# Patient Record
Sex: Female | Born: 1990 | Race: White | Hispanic: No | Marital: Single | Smoking: Never smoker
Health system: Southern US, Community
[De-identification: ages and names within clinical notes are randomized; demographics above are authoritative.]

## PROBLEM LIST (undated history)

## (undated) DIAGNOSIS — A630 Anogenital (venereal) warts: Secondary | ICD-10-CM

## (undated) DIAGNOSIS — Z3490 Encounter for supervision of normal pregnancy, unspecified, unspecified trimester: Secondary | ICD-10-CM

## (undated) DIAGNOSIS — Z3491 Encounter for supervision of normal pregnancy, unspecified, first trimester: Secondary | ICD-10-CM

## (undated) DIAGNOSIS — B379 Candidiasis, unspecified: Secondary | ICD-10-CM

## (undated) DIAGNOSIS — N898 Other specified noninflammatory disorders of vagina: Secondary | ICD-10-CM

## (undated) DIAGNOSIS — N926 Irregular menstruation, unspecified: Secondary | ICD-10-CM

## (undated) DIAGNOSIS — Z309 Encounter for contraceptive management, unspecified: Secondary | ICD-10-CM

## (undated) DIAGNOSIS — O039 Complete or unspecified spontaneous abortion without complication: Secondary | ICD-10-CM

## (undated) DIAGNOSIS — R350 Frequency of micturition: Secondary | ICD-10-CM

## (undated) DIAGNOSIS — Z3201 Encounter for pregnancy test, result positive: Secondary | ICD-10-CM

## (undated) DIAGNOSIS — E119 Type 2 diabetes mellitus without complications: Secondary | ICD-10-CM

## (undated) HISTORY — DX: Encounter for supervision of normal pregnancy, unspecified, unspecified trimester: Z34.90

## (undated) HISTORY — PX: OTHER SURGICAL HISTORY: SHX169

## (undated) HISTORY — DX: Candidiasis, unspecified: B37.9

## (undated) HISTORY — DX: Frequency of micturition: R35.0

## (undated) HISTORY — DX: Irregular menstruation, unspecified: N92.6

## (undated) HISTORY — DX: Encounter for supervision of normal pregnancy, unspecified, first trimester: Z34.91

## (undated) HISTORY — DX: Other specified noninflammatory disorders of vagina: N89.8

## (undated) HISTORY — DX: Complete or unspecified spontaneous abortion without complication: O03.9

## (undated) HISTORY — DX: Type 2 diabetes mellitus without complications: E11.9

## (undated) HISTORY — DX: Anogenital (venereal) warts: A63.0

## (undated) HISTORY — DX: Encounter for contraceptive management, unspecified: Z30.9

## (undated) HISTORY — PX: ANKLE FRACTURE SURGERY: SHX122

## (undated) HISTORY — DX: Encounter for pregnancy test, result positive: Z32.01

---
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
## 2008-05-09 ENCOUNTER — Emergency Department (HOSPITAL_COMMUNITY): Admission: EM | Admit: 2008-05-09 | Discharge: 2008-05-09 | Payer: Self-pay | Admitting: Emergency Medicine

## 2010-07-29 LAB — PREGNANCY, URINE: Preg Test, Ur: NEGATIVE

## 2010-08-27 ENCOUNTER — Other Ambulatory Visit (HOSPITAL_COMMUNITY)
Admission: RE | Admit: 2010-08-27 | Discharge: 2010-08-27 | Disposition: A | Discharge status: Home / Self Care | Payer: Self-pay | Source: Ambulatory Visit | Attending: Obstetrics & Gynecology | Admitting: Obstetrics & Gynecology

## 2010-08-27 ENCOUNTER — Other Ambulatory Visit: Payer: Self-pay | Admitting: Obstetrics & Gynecology

## 2010-08-27 DIAGNOSIS — Z01419 Encounter for gynecological examination (general) (routine) without abnormal findings: Secondary | ICD-10-CM | POA: Insufficient documentation
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
## 2010-10-08 ENCOUNTER — Other Ambulatory Visit: Payer: Self-pay | Admitting: Obstetrics and Gynecology

## 2011-01-08 ENCOUNTER — Encounter: Payer: Self-pay | Admitting: Emergency Medicine

## 2011-01-08 ENCOUNTER — Emergency Department (HOSPITAL_COMMUNITY): Payer: Self-pay

## 2011-01-08 ENCOUNTER — Emergency Department (HOSPITAL_COMMUNITY)
Admission: EM | Admit: 2011-01-08 | Discharge: 2011-01-08 | Disposition: A | Discharge status: Home / Self Care | Payer: Self-pay | Attending: Emergency Medicine | Admitting: Emergency Medicine

## 2011-01-08 DIAGNOSIS — M545 Low back pain, unspecified: Secondary | ICD-10-CM | POA: Insufficient documentation

## 2011-01-08 DIAGNOSIS — N390 Urinary tract infection, site not specified: Secondary | ICD-10-CM | POA: Insufficient documentation

## 2011-01-08 LAB — URINE MICROSCOPIC-ADD ON

## 2011-01-08 LAB — URINALYSIS, ROUTINE W REFLEX MICROSCOPIC
Bilirubin Urine: NEGATIVE
Ketones, ur: NEGATIVE mg/dL
Nitrite: NEGATIVE
Protein, ur: NEGATIVE mg/dL
Urobilinogen, UA: 0.2 mg/dL (ref 0.0–1.0)
pH: 6.5 (ref 5.0–8.0)

## 2011-01-08 MED ORDER — CEPHALEXIN 500 MG PO CAPS: 500.0000 mg | ORAL_CAPSULE | Freq: Four times a day (QID) | ORAL | Status: AC

## 2011-01-08 MED ORDER — HYDROCODONE-ACETAMINOPHEN 5-325 MG PO TABS: ORAL_TABLET | ORAL | Status: AC

## 2011-01-08 MED ORDER — HYDROCODONE-ACETAMINOPHEN 5-325 MG PO TABS
1.0000 | ORAL_TABLET | Freq: Once | ORAL | Status: AC
  Administered 2011-01-08: 1 via ORAL
  Filled 2011-01-08: qty 1

## 2011-01-08 MED ORDER — CYCLOBENZAPRINE HCL 10 MG PO TABS: 10.0000 mg | ORAL_TABLET | Freq: Three times a day (TID) | ORAL | Status: AC | PRN

## 2011-01-08 NOTE — ED Notes (Signed)
Pt c/o lower back pain x 1 week

## 2011-01-08 NOTE — ED Provider Notes (Signed)
History     CSN: [id number] Arrival date & time: 01/08/2011  3:32 PM  Chief Complaint  Patient presents with  . Back Pain    (Consider location/radiation/quality/duration/timing/severity/associated sxs/prior treatment) Patient is a 20 y.o. female presenting with back pain. The history is provided by the patient.  Back Pain  The current episode started more than 1 week ago. The problem occurs constantly. The problem has not changed since onset.The pain is associated with lifting heavy objects. The pain is present in the lumbar spine. The quality of the pain is described as burning and aching. The pain radiates to the right thigh. The pain is moderate. The symptoms are aggravated by bending, twisting and certain positions. The pain is the same all the time. Associated symptoms include leg pain. Pertinent negatives include no fever, no numbness, no headaches, no abdominal pain, no abdominal swelling, no bowel incontinence, no perianal numbness, no bladder incontinence, no dysuria, no pelvic pain, no paresthesias, no paresis, no tingling and no weakness. Associated symptoms comments: Left flank pain and left hip pain. She has tried NSAIDs for the symptoms. The treatment provided no relief.    History reviewed. No pertinent past medical history.  Past Surgical History  Procedure Date  . Ankle fracture surgery     History reviewed. No pertinent family history.  History  Substance Use Topics  . Smoking status: Never Smoker   . Smokeless tobacco: Not on file  . Alcohol Use: No    OB History    Grav Para Term Preterm Abortions TAB SAB Ect Mult Living   0               Review of Systems  Constitutional: Negative for fever and chills.  Gastrointestinal: Negative for abdominal pain and bowel incontinence.  Genitourinary: Negative for bladder incontinence, dysuria, urgency, decreased urine volume, vaginal bleeding, vaginal discharge, difficulty urinating, menstrual problem and pelvic  pain.  Musculoskeletal: Positive for myalgias and back pain.  Neurological: Negative for dizziness, tingling, weakness, numbness, headaches and paresthesias.  Hematological: Negative for adenopathy. Does not bruise/bleed easily.  All other systems reviewed and are negative.    Allergies  Review of patient's allergies indicates no known allergies.  Home Medications   Current Outpatient Rx  Name Route Sig Dispense Refill  . IBUPROFEN 200 MG PO TABS Oral Take 200 mg by mouth every 6 (six) hours as needed. Back pain       BP 109/64  Pulse 60  Temp 98.5 F (36.9 C)  Resp 20  Ht 5\' 1"  (1.549 m)  Wt 145 lb 5 oz (65.913 kg)  BMI 27.46 kg/m2  SpO2 95%  LMP 12/17/2010  Physical Exam  Nursing note and vitals reviewed. Constitutional: She appears well-developed and well-nourished. No distress.  HENT:  Head: Normocephalic and atraumatic.  Mouth/Throat: Oropharynx is clear and moist.  Neck: Normal range of motion. Neck supple.  Cardiovascular: Normal rate, regular rhythm and normal heart sounds.   Pulmonary/Chest: Effort normal and breath sounds normal.  Musculoskeletal: She exhibits tenderness. She exhibits no edema.       Right shoulder: She exhibits tenderness. She exhibits normal range of motion, no bony tenderness, no swelling, no laceration, normal pulse and normal strength.       Arms: Lymphadenopathy:    She has no cervical adenopathy.  Neurological: She is alert. She has normal reflexes. No cranial nerve deficit or sensory deficit. She exhibits normal muscle tone. Coordination and gait normal.  Reflex Scores:  Patellar reflexes are 2+ on the right side and 2+ on the left side.      Achilles reflexes are 2+ on the right side and 2+ on the left side. Skin: Skin is warm and dry.    ED Course  Procedures (including critical care time)  Results for orders placed during the hospital encounter of 01/08/11  URINALYSIS, ROUTINE W REFLEX MICROSCOPIC      Component Value  Range   Color, Urine YELLOW  YELLOW    Appearance CLEAR  CLEAR    Specific Gravity, Urine 1.010  1.005 - 1.030    pH 6.5  5.0 - 8.0    Glucose, UA NEGATIVE  NEGATIVE (mg/dL)   Hgb urine dipstick NEGATIVE  NEGATIVE    Bilirubin Urine NEGATIVE  NEGATIVE    Ketones, ur NEGATIVE  NEGATIVE (mg/dL)   Protein, ur NEGATIVE  NEGATIVE (mg/dL)   Urobilinogen, UA 0.2  0.0 - 1.0 (mg/dL)   Nitrite NEGATIVE  NEGATIVE    Leukocytes, UA SMALL (*) NEGATIVE   POCT PREGNANCY, URINE      Component Value Range   Preg Test, Ur NEGATIVE    URINE MICROSCOPIC-ADD ON      Component Value Range   Squamous Epithelial / LPF RARE  RARE    WBC, UA 3-6  <3 (WBC/hpf)   Bacteria, UA MANY (*) RARE     Dg Lumbar Spine Complete  01/08/2011  *RADIOLOGY REPORT*  Clinical Data: Low back pain for 1 week.  LUMBAR SPINE - COMPLETE 4+ VIEW  Comparison: None.  Findings: Lumbar vertebral alignment appears normal.  No lumbar spine fracture or acute subluxation is identified.  No acute lumbar spine findings noted.  IMPRESSION:  No significant abnormality identified.  Original Report Authenticated By: Dellia Cloud, M.D.        MDM    5:45 PM patient is ambulatory, no focal neuro deficits or motor weakness.  ttp of the right lumbar paraspinal muscles.  No CVA tenderness, fever, vomiting or obvious urinary sx's to suggest pyleonephritis  5:48 PM urine culture pending.  Will treat for UTI.  Pt agrees to follow-up with PMD      Bralee Feldt L. [place], [place] 01/08/11 1749

## 2011-01-08 NOTE — ED Notes (Signed)
Transported to radiology 

## 2011-01-10 LAB — URINE CULTURE: Culture  Setup Time: 201209270118

## 2011-01-10 NOTE — ED Provider Notes (Signed)
Medical screening examination/treatment/procedure(s) were performed by non-physician practitioner and as supervising physician I was immediately available for consultation/collaboration.   Geoffery Lyons, MD 01/10/11 [phone]

## 2011-01-11 NOTE — ED Notes (Signed)
+   urine culture. Tx'd with Keflex, sensitive to same per protocol MD

## 2011-01-28 LAB — CBC
HCT: 24.6 — ABNORMAL LOW
HCT: 34.2 — ABNORMAL LOW
HCT: 35.6 — ABNORMAL LOW
HCT: 35.8 — ABNORMAL LOW
HCT: 36
Hemoglobin: 11.1 — ABNORMAL LOW
Hemoglobin: 11.7 — ABNORMAL LOW
Hemoglobin: 12
Hemoglobin: 12.1
Hemoglobin: 12.1
Hemoglobin: 13.2
MCHC: 33.1
MCHC: 33.7
MCHC: 33.7
MCHC: 34.2
MCV: 81.8 — ABNORMAL LOW
MCV: 82.3
MCV: 83.2
MCV: 83.2
Platelets: 102 — ABNORMAL LOW
Platelets: 86 — ABNORMAL LOW
Platelets: 93 — ABNORMAL LOW
RBC: 4.02
RBC: 4.16
RBC: 4.34
RBC: 4.78
RDW: 12.8
RDW: 13
RDW: 13.2
RDW: 13.2
RDW: 13.3
RDW: 13.6
WBC: 7.9
WBC: 8.3
WBC: 9.3

## 2011-01-28 LAB — BASIC METABOLIC PANEL
BUN: 1 — ABNORMAL LOW
BUN: 4 — ABNORMAL LOW
CO2: 30
CO2: 30
Calcium: 9.2
GFR calc Af Amer: >60
GFR calc non Af Amer: >60
GFR calc non Af Amer: >60
Glucose, Bld: 100 — ABNORMAL HIGH
Glucose, Bld: 102 — ABNORMAL HIGH
Glucose, Bld: 88
Potassium: 3.8
Potassium: 4.1
Potassium: 4.2
Sodium: 139
Sodium: 139

## 2011-01-28 LAB — DIFFERENTIAL
Basophils Absolute: 0
Basophils Relative: 0
Eosinophils Absolute: 0.2
Eosinophils Relative: 3
Monocytes Absolute: 0.7

## 2011-01-28 LAB — TYPE AND SCREEN
ABO/RH(D): O POS
Antibody Screen: NEGATIVE

## 2011-01-29 LAB — CBC
HCT: 28.2 — ABNORMAL LOW
HCT: 28.6 — ABNORMAL LOW
HCT: 29.4 — ABNORMAL LOW
Hemoglobin: 9.3 — ABNORMAL LOW
Hemoglobin: 9.4 — ABNORMAL LOW
Hemoglobin: 9.4 — ABNORMAL LOW
Hemoglobin: 9.9 — ABNORMAL LOW
MCHC: 33.3
MCHC: 33.3
MCHC: 33.6
MCV: 80.2 — ABNORMAL LOW
MCV: 80.9 — ABNORMAL LOW
MCV: 81.6 — ABNORMAL LOW
MCV: 81.8 — ABNORMAL LOW
MCV: 82
MCV: 82.1
Platelets: 130 — ABNORMAL LOW
Platelets: 178
RBC: 3.4 — ABNORMAL LOW
RBC: 3.42 — ABNORMAL LOW
RBC: 3.44 — ABNORMAL LOW
RBC: 3.63 — ABNORMAL LOW
RBC: 3.66 — ABNORMAL LOW
RDW: 12.7
RDW: 12.7
RDW: 13.3
WBC: 14.1 — ABNORMAL HIGH
WBC: 8.8
WBC: 9.6
WBC: 9.9

## 2011-01-29 LAB — RAPID URINE DRUG SCREEN, HOSP PERFORMED
Amphetamines: NOT DETECTED
Barbiturates: NOT DETECTED
Benzodiazepines: POSITIVE — AB
Cocaine: NOT DETECTED
Opiates: NOT DETECTED
Tetrahydrocannabinol: NOT DETECTED

## 2011-01-29 LAB — I-STAT 8, (EC8 V) (CONVERTED LAB)
Acid-base deficit: 6 — ABNORMAL HIGH
BUN: 5 — ABNORMAL LOW
Bicarbonate: 20.2
Chloride: 108
Glucose, Bld: 224 — ABNORMAL HIGH
HCT: 42
Hemoglobin: 14.3
Operator id: 281221
Potassium: 3.4 — ABNORMAL LOW
Sodium: 140
TCO2: 21
pCO2, Ven: 42.2 — ABNORMAL LOW
pH, Ven: 7.288

## 2011-01-29 LAB — URINALYSIS, ROUTINE W REFLEX MICROSCOPIC
Bilirubin Urine: NEGATIVE
Glucose, UA: NEGATIVE
Ketones, ur: NEGATIVE
Leukocytes, UA: NEGATIVE
Nitrite: NEGATIVE
Protein, ur: 100 — AB
Specific Gravity, Urine: 1.013
Urobilinogen, UA: 0.2
pH: 7

## 2011-01-29 LAB — POCT I-STAT CREATININE
Creatinine, Ser: 0.7
Operator id: 281221

## 2011-01-29 LAB — COMPREHENSIVE METABOLIC PANEL
ALT: 71 — ABNORMAL HIGH
AST: 161 — ABNORMAL HIGH
Albumin: 3 — ABNORMAL LOW
Alkaline Phosphatase: 52
BUN: 1 — ABNORMAL LOW
CO2: 26
Calcium: 8.9
Chloride: 108
Creatinine, Ser: 0.55
GFR calc Af Amer: >60
GFR calc non Af Amer: >60
Glucose, Bld: 130 — ABNORMAL HIGH
Potassium: 3.8
Sodium: 140
Total Bilirubin: 0.8
Total Protein: 5.5 — ABNORMAL LOW

## 2011-01-29 LAB — BASIC METABOLIC PANEL
Calcium: 9.1
Chloride: 106
Creatinine, Ser: 0.54
GFR calc Af Amer: >60
Sodium: 139

## 2011-01-29 LAB — PROTIME-INR
INR: 1.2
INR: 1.3
Prothrombin Time: 15.4 — ABNORMAL HIGH
Prothrombin Time: 16.5 — ABNORMAL HIGH

## 2011-01-29 LAB — POCT PREGNANCY, URINE: Preg Test, Ur: NEGATIVE

## 2011-01-29 LAB — CROSSMATCH: Antibody Screen: NEGATIVE

## 2011-01-29 LAB — URINE MICROSCOPIC-ADD ON

## 2011-01-29 LAB — ABO/RH: ABO/RH(D): O POS

## 2011-01-29 LAB — LIPASE, BLOOD: Lipase: 37

## 2011-11-24 ENCOUNTER — Other Ambulatory Visit: Payer: Self-pay | Admitting: Obstetrics and Gynecology

## 2013-02-21 ENCOUNTER — Other Ambulatory Visit: Payer: Self-pay | Admitting: Obstetrics & Gynecology

## 2013-03-08 ENCOUNTER — Ambulatory Visit (INDEPENDENT_AMBULATORY_CARE_PROVIDER_SITE_OTHER): Payer: Self-pay | Admitting: Adult Health

## 2013-03-08 ENCOUNTER — Encounter: Payer: Self-pay | Admitting: Adult Health

## 2013-03-08 VITALS — BP 100/72 | Ht 61.0 in | Wt 144.5 lb

## 2013-03-08 DIAGNOSIS — Z3201 Encounter for pregnancy test, result positive: Secondary | ICD-10-CM

## 2013-03-08 DIAGNOSIS — Z3490 Encounter for supervision of normal pregnancy, unspecified, unspecified trimester: Secondary | ICD-10-CM

## 2013-03-08 DIAGNOSIS — N898 Other specified noninflammatory disorders of vagina: Secondary | ICD-10-CM

## 2013-03-08 HISTORY — DX: Other specified noninflammatory disorders of vagina: N89.8

## 2013-03-08 NOTE — Progress Notes (Signed)
Subjective:     Patient ID: [name], female   DOB: 10/22/1990, 22 y.o.   MRN: [id number]  HPI Javaria is a 22 year old white female in complaining of a vaginal discharge, has used Metrogel and monistat recently.Had some itching,but not now.She had a surprise + pregnancy test today in office.  She is in school and has been with same partner x 6 years and did not think she could get pregnant. Review of Systems See HPI Reviewed past medical,surgical, social and family history. Reviewed medications and allergies.     Objective:   Physical Exam BP 100/72  Ht 5\' 1"  (1.549 m)  Wt 144 lb 8 oz (65.545 kg)  BMI 27.32 kg/m2  LMP 10/05/2014Urine pregnancy test +, Skin warm and dry.Pelvic: external genitalia is normal in appearance, vagina: scant white discharge without odor, cervix:smooth , uterus: normal size, shape and contour, non tender, no masses felt, adnexa: no masses or tenderness noted.    Assessment:     Vaginal discharge Pregnant     Plan:     Given samples of Relnate DHA 1 daily Check QHCG, call in am for results Given confirmation for medicaid application Follow up in 2 weeks for [place] and new OB visit

## 2013-03-08 NOTE — Patient Instructions (Signed)
Call in am for [facility] Take relnate 1 daily Follow up in 2 weeks for [place] and new OB Pregnancy - First Trimester During sexual intercourse, millions of sperm go into the vagina. Only 1 sperm will penetrate and fertilize the female egg while it is in the Fallopian tube. One week later, the fertilized egg implants into the wall of the uterus. An embryo begins to develop into a baby. At 6 to 8 weeks, the eyes and face are formed and the heartbeat can be seen on ultrasound. At the end of 12 weeks (first trimester), all the baby's organs are formed. Now that you are pregnant, you will want to do everything you can to have a healthy baby. Two of the most important things are to get good prenatal care and follow your caregiver's instructions. Prenatal care is all the medical care you receive before the baby's birth. It is given to prevent, find, and treat problems during the pregnancy and childbirth. PRENATAL EXAMS  During prenatal visits, your weight, blood pressure, and urine are checked. This is done to make sure you are healthy and progressing normally during the pregnancy.  A pregnant woman should gain 25 to 35 pounds during the pregnancy. However, if you are overweight or underweight, your caregiver will advise you regarding your weight.  Your caregiver will ask and answer questions for you.  Blood work, cervical cultures, other necessary tests, and a Pap test are done during your prenatal exams. These tests are done to check on your health and the probable health of your baby. Tests are strongly recommended and done for HIV with your permission. This is the virus that causes AIDS. These tests are done because medicines can be given to help prevent your baby from being born with this infection should you have been infected without knowing it. Blood work is also used to find out your blood type, previous infections, and follow your blood levels (hemoglobin).  Low hemoglobin (anemia) is common during  pregnancy. Iron and vitamins are given to help prevent this. Later in the pregnancy, blood tests for diabetes will be done along with any other tests if any problems develop.  You may need other tests to make sure you and the baby are doing well. CHANGES DURING THE FIRST TRIMESTER  Your body goes through many changes during pregnancy. They vary from person to person. Talk to your caregiver about changes you notice and are concerned about. Changes can include:  Your menstrual period stops.  The egg and sperm carry the genes that determine what you look like. Genes from you and your partner are forming a baby. The female genes determine whether the baby is a boy or a girl.  Your body increases in girth and you may feel bloated.  Feeling sick to your stomach (nauseous) and throwing up (vomiting). If the vomiting is uncontrollable, call your caregiver.  Your breasts will begin to enlarge and become tender.  Your nipples may stick out more and become darker.  The need to urinate more. Painful urination may mean you have a bladder infection.  Tiring easily.  Loss of appetite.  Cravings for certain kinds of food.  At first, you may gain or lose a couple of pounds.  You may have changes in your emotions from day to day (excited to be pregnant or concerned something may go wrong with the pregnancy and baby).  You may have more vivid and strange dreams. HOME CARE INSTRUCTIONS   It is very important to  avoid all smoking, alcohol and non-prescribed drugs during your pregnancy. These affect the formation and growth of the baby. Avoid chemicals while pregnant to ensure the delivery of a healthy infant.  Start your prenatal visits by the 12th week of pregnancy. They are usually scheduled monthly at first, then more often in the last 2 months before delivery. Keep your caregiver's appointments. Follow your caregiver's instructions regarding medicine use, blood and lab tests, exercise, and  diet.  During pregnancy, you are providing food for you and your baby. Eat regular, well-balanced meals. Choose foods such as meat, fish, milk and other low fat dairy products, vegetables, fruits, and whole-grain breads and cereals. Your caregiver will tell you of the ideal weight gain.  You can help morning sickness by keeping soda crackers at the bedside. Eat a couple before arising in the morning. You may want to use the crackers without salt on them.  Eating 4 to 5 small meals rather than 3 large meals a day also may help the nausea and vomiting.  Drinking liquids between meals instead of during meals also seems to help nausea and vomiting.  A physical sexual relationship may be continued throughout pregnancy if there are no other problems. Problems may be early (premature) leaking of amniotic fluid from the membranes, vaginal bleeding, or belly (abdominal) pain.  Exercise regularly if there are no restrictions. Check with your caregiver or physical therapist if you are unsure of the safety of some of your exercises. Greater weight gain will occur in the last 2 trimesters of pregnancy. Exercising will help:  Control your weight.  Keep you in shape.  Prepare you for labor and delivery.  Help you lose your pregnancy weight after you deliver your baby.  Wear a good support or jogging bra for breast tenderness during pregnancy. This may help if worn during sleep too.  Ask when prenatal classes are available. Begin classes when they are offered.  Do not use hot tubs, steam rooms, or saunas.  Wear your seat belt when driving. This protects you and your baby if you are in an accident.  Avoid raw meat, uncooked cheese, cat litter boxes, and soil used by cats throughout the pregnancy. These carry germs that can cause birth defects in the baby.  The first trimester is a good time to visit your dentist for your dental health. Getting your teeth cleaned is okay. Use a softer toothbrush and  brush gently during pregnancy.  Ask for help if you have financial, counseling, or nutritional needs during pregnancy. Your caregiver will be able to offer counseling for these needs as well as refer you for other special needs.  Do not take any medicines or herbs unless told by your caregiver.  Inform your caregiver if there is any mental or physical domestic violence.  Make a list of emergency phone numbers of family, friends, hospital, and police and fire departments.  Write down your questions. Take them to your prenatal visit.  Do not douche.  Do not cross your legs.  If you have to stand for long periods of time, rotate you feet or take small steps in a circle.  You may have more vaginal secretions that may require a sanitary pad. Do not use tampons or scented sanitary pads. MEDICINES AND DRUG USE IN PREGNANCY  Take prenatal vitamins as directed. The vitamin should contain 1 milligram of folic acid. Keep all vitamins out of reach of children. Only a couple vitamins or tablets containing iron may be fatal  to a baby or young child when ingested.  Avoid use of all medicines, including herbs, over-the-counter medicines, not prescribed or suggested by your caregiver. Only take over-the-counter or prescription medicines for pain, discomfort, or fever as directed by your caregiver. Do not use aspirin, ibuprofen, or naproxen unless directed by your caregiver.  Let your caregiver also know about herbs you may be using.  Alcohol is related to a number of birth defects. This includes fetal alcohol syndrome. All alcohol, in any form, should be avoided completely. Smoking will cause low birth rate and premature babies.  Street or illegal drugs are very harmful to the baby. They are absolutely forbidden. A baby born to an addicted mother will be addicted at birth. The baby will go through the same withdrawal an adult does.  Let your caregiver know about any medicines that you have to take and  for what reason you take them. SEEK MEDICAL CARE IF:  You have any concerns or worries during your pregnancy. It is better to call with your questions if you feel they cannot wait, rather than worry about them. SEEK IMMEDIATE MEDICAL CARE IF:   An unexplained oral temperature above 102 F (38.9 C) develops, or as your caregiver suggests.  You have leaking of fluid from the vagina (birth canal). If leaking membranes are suspected, take your temperature and inform your caregiver of this when you call.  There is vaginal spotting or bleeding. Notify your caregiver of the amount and how many pads are used.  You develop a bad smelling vaginal discharge with a change in the color.  You continue to feel sick to your stomach (nauseated) and have no relief from remedies suggested. You vomit blood or coffee ground-like materials.  You lose more than 2 pounds of weight in 1 week.  You gain more than 2 pounds of weight in 1 week and you notice swelling of your face, hands, feet, or legs.  You gain 5 pounds or more in 1 week (even if you do not have swelling of your hands, face, legs, or feet).  You get exposed to [place] measles and have never had them.  You are exposed to fifth disease or chickenpox.  You develop belly (abdominal) pain. Round ligament discomfort is a common non-cancerous (benign) cause of abdominal pain in pregnancy. Your caregiver still must evaluate this.  You develop headache, fever, diarrhea, pain with urination, or shortness of breath.  You fall or are in a car accident or have any kind of trauma.  There is mental or physical violence in your home. Document Released: 03/25/2001 Document Revised: 12/24/2011 Document Reviewed: 09/26/2008 [facility] Patient Information 2014 [place], [place].

## 2013-03-09 ENCOUNTER — Telehealth: Payer: Self-pay | Admitting: Adult Health

## 2013-03-09 LAB — HCG, QUANTITATIVE, PREGNANCY: hCG, Beta Chain, Quant, S: 49.4 m[IU]/mL

## 2013-03-09 NOTE — Telephone Encounter (Signed)
Throat itchy ok is use warm salt water gargles and cough gtts

## 2013-03-09 NOTE — Telephone Encounter (Signed)
Pt aware of QHCG keep appt

## 2013-03-14 ENCOUNTER — Telehealth: Payer: Self-pay | Admitting: Adult Health

## 2013-03-14 ENCOUNTER — Telehealth: Payer: Self-pay

## 2013-03-14 NOTE — Telephone Encounter (Signed)
Pt wants to ask Victorino Dike a few questions. Pt been reading a lot of things online, pt states that she thinks that she is farther along than what she said. Pt states that she is having more symptoms. Pt wants to speak to JAG.

## 2013-03-14 NOTE — Telephone Encounter (Signed)
Pt has already spoken to JAG from last message.

## 2013-03-14 NOTE — Telephone Encounter (Signed)
Complains of discharge and anxious to come in in am at 8:30

## 2013-03-15 ENCOUNTER — Telehealth: Payer: Self-pay | Admitting: Adult Health

## 2013-03-15 ENCOUNTER — Encounter: Payer: Self-pay | Admitting: Adult Health

## 2013-03-15 ENCOUNTER — Ambulatory Visit (INDEPENDENT_AMBULATORY_CARE_PROVIDER_SITE_OTHER): Payer: Medicaid Other | Admitting: Adult Health

## 2013-03-15 VITALS — BP 120/76 | Ht 61.0 in | Wt 144.0 lb

## 2013-03-15 DIAGNOSIS — O200 Threatened abortion: Secondary | ICD-10-CM

## 2013-03-15 DIAGNOSIS — O209 Hemorrhage in early pregnancy, unspecified: Secondary | ICD-10-CM

## 2013-03-15 NOTE — Telephone Encounter (Signed)
Pt aware numbers dropped and that means miscarriage

## 2013-03-15 NOTE — Telephone Encounter (Signed)
Pt spoke with JAG earlier about lab results.

## 2013-03-15 NOTE — Patient Instructions (Signed)
No sex Follow up in 2 days for labs

## 2013-03-15 NOTE — Progress Notes (Signed)
Subjective:     Patient ID: Anna Hartman, female   DOB: 1991/03/01, 22 y.o.   MRN: [id number]  HPI Anna Hartman is a 22 year old white female, who had surprise +pregnancy test last week in today with vaginal bleeding and some cramps.It started as discharge yesterday.  Review of Systems See HPI Reviewed past medical,surgical, social and family history. Reviewed medications and allergies.     Objective:   Physical Exam BP 120/76  Ht 5\' 1"  (1.549 m)  Wt 144 lb (65.318 kg)  BMI 27.22 kg/m2  LMP 01/16/2013   Skin warm and dry.Pelvic: external genitalia is normal in appearance, vagina:period like blood, cervix:smooth, uterus: normal size, shape and contour, mildly tender, no masses felt, adnexa: no masses or tenderness noted.  Assessment:     Vaginal bleeding in early pregnancy, threatened ab    Plan:     Check QHCG stat,progesterone level and ABORH Follow up in 2 days for Anna Hartman  Will talk today when lab back No sex Review handout on understanding miscarriage by Anna Hartman for more understanding

## 2013-03-16 ENCOUNTER — Telehealth: Payer: Self-pay | Admitting: Adult Health

## 2013-03-16 LAB — ABO AND RH

## 2013-03-16 LAB — PROGESTERONE: Progesterone: 1.6 ng/mL

## 2013-03-16 NOTE — Telephone Encounter (Signed)
Left message to call.

## 2013-03-17 ENCOUNTER — Other Ambulatory Visit: Payer: Self-pay

## 2013-03-17 ENCOUNTER — Encounter: Payer: Self-pay | Admitting: *Deleted

## 2013-03-23 ENCOUNTER — Other Ambulatory Visit: Payer: Self-pay

## 2013-03-23 ENCOUNTER — Ambulatory Visit (INDEPENDENT_AMBULATORY_CARE_PROVIDER_SITE_OTHER): Payer: Medicaid Other | Admitting: Adult Health

## 2013-03-23 ENCOUNTER — Encounter: Payer: Self-pay | Admitting: Adult Health

## 2013-03-23 VITALS — BP 124/78 | Ht 61.0 in | Wt 143.0 lb

## 2013-03-23 DIAGNOSIS — Z3049 Encounter for surveillance of other contraceptives: Secondary | ICD-10-CM

## 2013-03-23 DIAGNOSIS — Z309 Encounter for contraceptive management, unspecified: Secondary | ICD-10-CM

## 2013-03-23 DIAGNOSIS — N898 Other specified noninflammatory disorders of vagina: Secondary | ICD-10-CM

## 2013-03-23 DIAGNOSIS — B379 Candidiasis, unspecified: Secondary | ICD-10-CM

## 2013-03-23 DIAGNOSIS — Z3202 Encounter for pregnancy test, result negative: Secondary | ICD-10-CM

## 2013-03-23 HISTORY — DX: Candidiasis, unspecified: B37.9

## 2013-03-23 HISTORY — DX: Encounter for contraceptive management, unspecified: Z30.9

## 2013-03-23 LAB — POCT WET PREP (WET MOUNT)

## 2013-03-23 MED ORDER — NORGESTIMATE-ETH ESTRADIOL 0.25-35 MG-MCG PO TABS: 1.0000 | ORAL_TABLET | Freq: Every day | ORAL | Status: DC

## 2013-03-23 MED ORDER — FLUCONAZOLE 150 MG PO TABS: ORAL_TABLET | ORAL | Status: DC

## 2013-03-23 NOTE — Patient Instructions (Signed)
Monilial Vaginitis Vaginitis in a soreness, swelling and redness (inflammation) of the vagina and vulva. Monilial vaginitis is not a sexually transmitted infection. CAUSES  Yeast vaginitis is caused by yeast (candida) that is normally found in your vagina. With a yeast infection, the candida has overgrown in number to a point that upsets the chemical balance. SYMPTOMS   White, thick vaginal discharge.  Swelling, itching, redness and irritation of the vagina and possibly the lips of the vagina (vulva).  Burning or painful urination.  Painful intercourse. DIAGNOSIS  Things that may contribute to monilial vaginitis are:  Postmenopausal and virginal states.  Pregnancy.  Infections.  Being tired, sick or stressed, especially if you had monilial vaginitis in the past.  Diabetes. Good control will help lower the chance.  Birth control pills.  Tight fitting garments.  Using bubble bath, feminine sprays, douches or deodorant tampons.  Taking certain medications that kill germs (antibiotics).  Sporadic recurrence can occur if you become ill. TREATMENT  Your caregiver will give you medication.  There are several kinds of anti monilial vaginal creams and suppositories specific for monilial vaginitis. For recurrent yeast infections, use a suppository or cream in the vagina 2 times a week, or as directed.  Anti-monilial or steroid cream for the itching or irritation of the vulva may also be used. Get your caregiver's permission.  Painting the vagina with methylene blue solution may help if the monilial cream does not work.  Eating yogurt may help prevent monilial vaginitis. HOME CARE INSTRUCTIONS   Finish all medication as prescribed.  Do not have sex until treatment is completed or after your caregiver tells you it is okay.  Take warm sitz baths.  Do not douche.  Do not use tampons, especially scented ones.  Wear cotton underwear.  Avoid tight pants and panty  hose.  Tell your sexual partner that you have a yeast infection. They should go to their caregiver if they have symptoms such as mild rash or itching.  Your sexual partner should be treated as well if your infection is difficult to eliminate.  Practice safer sex. Use condoms.  Some vaginal medications cause latex condoms to fail. Vaginal medications that harm condoms are:  Cleocin cream.  Butoconazole (Femstat).  Terconazole (Terazol) vaginal suppository.  Miconazole (Monistat) (may be purchased over the counter). SEEK MEDICAL CARE IF:   You have a temperature by mouth above 102 F (38.9 C).  The infection is getting worse after 2 days of treatment.  The infection is not getting better after 3 days of treatment.  You develop blisters in or around your vagina.  You develop vaginal bleeding, and it is not your menstrual period.  You have pain when you urinate.  You develop intestinal problems.  You have pain with sexual intercourse. Document Released: 01/08/2005 Document Revised: 06/23/2011 Document Reviewed: 09/22/2008 [facility] Patient Information 2014 [place], [place]. Start pills today use condoms x 1 month Go to DSS and get family planning medicaid

## 2013-03-23 NOTE — Progress Notes (Signed)
Subjective:     Patient ID: Anna Hartman, female   DOB: 1991/03/11, 22 y.o.   MRN: [id number]  HPI Anna Hartman is a 22 year old white female complaining of white discharge and wants OCs, she is sp miscarriage.  Review of Systems See HPI Reviewed past medical,surgical, social and family history. Reviewed medications and allergies.     Objective:   Physical Exam BP 124/78  Ht 5\' 1"  (1.549 m)  Wt 143 lb (64.864 kg)  BMI 27.03 kg/m2  LMP 10/05/2014UPT negative,  Skin warm and dry.Pelvic: external genitalia is normal in appearance, vagina: white grainy discharge without odor, cervix:smooth, uterus: normal size, shape and contour, non tender, no masses felt, adnexa: no masses or tenderness noted. Wet prep: + for yeast    Assessment:     Vaginal discharge Yeast Contraceptive management    Plan:     Rx diflucan 150 mg #2 1 now and 1 in 3 days with 1 refill Rx sprintec disp 1 pack take 1 daily starting today with 11 refills, use condoms x 1 month Follow up prn

## 2013-05-26 ENCOUNTER — Ambulatory Visit (INDEPENDENT_AMBULATORY_CARE_PROVIDER_SITE_OTHER): Payer: Medicaid Other | Admitting: Advanced Practice Midwife

## 2013-05-26 ENCOUNTER — Encounter: Payer: Self-pay | Admitting: Advanced Practice Midwife

## 2013-05-26 VITALS — BP 120/58 | Ht 61.0 in | Wt 147.0 lb

## 2013-05-26 DIAGNOSIS — Z1389 Encounter for screening for other disorder: Secondary | ICD-10-CM

## 2013-05-26 DIAGNOSIS — N390 Urinary tract infection, site not specified: Secondary | ICD-10-CM

## 2013-05-26 DIAGNOSIS — N898 Other specified noninflammatory disorders of vagina: Secondary | ICD-10-CM

## 2013-05-26 LAB — POCT URINALYSIS DIPSTICK
GLUCOSE UA: NEGATIVE
Ketones, UA: NEGATIVE
LEUKOCYTES UA: NEGATIVE
NITRITE UA: NEGATIVE
Protein, UA: NEGATIVE

## 2013-05-26 NOTE — Progress Notes (Signed)
CHIEF COMPLAINT/HPI:  23 y.o. female complains of white vaginal discharge for 1-2  week(s). Denies abnormal vaginal bleeding, significant pelvic pain or fever. No UTI symptoms. Sexually active, does not use condoms, no change in partner for   Last unprotected intercourse 2 days ago.  Denies history of known exposure to STD or symptoms in partner.  Patient's last menstrual period was 03/30/2013. when she had a early SAB.  She did not start COC's, and declines birth control now.  She is indifferent as to whether she gets pregnant.  Feels like her vaginal idscharge and odor are "not normal".  Denies itching or irritation. Past Medical History: Past Medical History  Diagnosis Date  . Genital warts   . Vaginal discharge 03/08/2013  . Yeast infection 03/23/2013  . Contraceptive management 03/23/2013  . Miscarriage     Past Surgical History: Past Surgical History  Procedure Laterality Date  . Ankle fracture surgery      Obstetrical History: OB History   Grav Para Term Preterm Abortions TAB SAB Ect Mult Living   1    1  1    0       Social History: History   Social History  . Marital Status: Single    Spouse Name: N/A    Number of Children: N/A  . Years of Education: N/A   Social History Main Topics  . Smoking status: Never Smoker   . Smokeless tobacco: Never Used  . Alcohol Use: No  . Drug Use: No  . Sexual Activity: Yes    Birth Control/ Protection: None   Other Topics Concern  . None   Social History Narrative  . None    Family History: Family History  Problem Relation Age of Onset  . Cancer Paternal Grandmother     Allergies: No Known Allergies      PHYSICAL EXAM: Pelvic - normal external genitalia, vulva, vagina, cervix, uterus and adnexa, no odor WET MOUNT done - results: negative for pathogens, normal epithelial cells   Labs: Results for orders placed in visit on 05/26/13 (from the past 24 hour(s))  POCT URINALYSIS DIPSTICK   Collection Time   05/26/13  2:02 PM      Result Value Ref Range   Color, UA       Clarity, UA       Glucose, UA neg     Bilirubin, UA       Ketones, UA neg     Spec Grav, UA       Blood, UA trace     pH, UA       Protein, UA neg     Urobilinogen, UA       Nitrite, UA neg     Leukocytes, UA Negative       Assessment: normal vaginal discharge  Plan:  Orders Placed This Encounter  Procedures  . POCT Urinalysis Dipstick   Perhaps pt is sensitive to hormonal changes, sweat, etc.  Advised that she should consider herself fertile even though she has not had a period since her SAB 6 weeks ago.    Samples of repHresh oral and vaginal gel given to try  Hartman,Anna Belson 05/26/2013 2:43 PM

## 2013-05-27 ENCOUNTER — Telehealth: Payer: Self-pay | Admitting: Adult Health

## 2013-05-27 MED ORDER — FLUCONAZOLE 150 MG PO TABS: 150.0000 mg | ORAL_TABLET | Freq: Once | ORAL | Status: DC

## 2013-05-27 NOTE — Telephone Encounter (Signed)
No voice mail, called diflucan in

## 2013-06-21 ENCOUNTER — Ambulatory Visit: Payer: Self-pay | Admitting: Adult Health

## 2013-06-23 ENCOUNTER — Ambulatory Visit: Payer: Self-pay | Admitting: Adult Health

## 2013-06-23 ENCOUNTER — Encounter: Payer: Self-pay | Admitting: *Deleted

## 2013-06-30 ENCOUNTER — Ambulatory Visit: Payer: Self-pay | Admitting: Adult Health

## 2013-11-15 ENCOUNTER — Encounter (HOSPITAL_COMMUNITY): Payer: Self-pay | Admitting: Emergency Medicine

## 2013-11-15 ENCOUNTER — Emergency Department (HOSPITAL_COMMUNITY)
Admission: EM | Admit: 2013-11-15 | Discharge: 2013-11-15 | Disposition: A | Discharge status: Home / Self Care | Payer: Medicaid Other | Attending: Emergency Medicine | Admitting: Emergency Medicine

## 2013-11-15 DIAGNOSIS — Z3202 Encounter for pregnancy test, result negative: Secondary | ICD-10-CM | POA: Insufficient documentation

## 2013-11-15 DIAGNOSIS — R6883 Chills (without fever): Secondary | ICD-10-CM | POA: Diagnosis not present

## 2013-11-15 DIAGNOSIS — Z8742 Personal history of other diseases of the female genital tract: Secondary | ICD-10-CM | POA: Insufficient documentation

## 2013-11-15 DIAGNOSIS — K5289 Other specified noninfective gastroenteritis and colitis: Secondary | ICD-10-CM | POA: Diagnosis not present

## 2013-11-15 DIAGNOSIS — R112 Nausea with vomiting, unspecified: Secondary | ICD-10-CM | POA: Diagnosis present

## 2013-11-15 DIAGNOSIS — Z8619 Personal history of other infectious and parasitic diseases: Secondary | ICD-10-CM | POA: Diagnosis not present

## 2013-11-15 DIAGNOSIS — R350 Frequency of micturition: Secondary | ICD-10-CM | POA: Diagnosis not present

## 2013-11-15 DIAGNOSIS — K529 Noninfective gastroenteritis and colitis, unspecified: Secondary | ICD-10-CM

## 2013-11-15 LAB — CBC WITH DIFFERENTIAL/PLATELET
BASOS ABS: 0.1 10*3/uL (ref 0.0–0.1)
Basophils Relative: 1 % (ref 0–1)
EOS ABS: 0.2 10*3/uL (ref 0.0–0.7)
Eosinophils Relative: 3 % (ref 0–5)
HEMATOCRIT: 38 % (ref 36.0–46.0)
Hemoglobin: 13 g/dL (ref 12.0–15.0)
LYMPHS ABS: 2.3 10*3/uL (ref 0.7–4.0)
Lymphocytes Relative: 38 % (ref 12–46)
MCH: 27.6 pg (ref 26.0–34.0)
MCHC: 34.2 g/dL (ref 30.0–36.0)
MCV: 80.7 fL (ref 78.0–100.0)
MONOS PCT: 13 % — AB (ref 3–12)
Monocytes Absolute: 0.8 10*3/uL (ref 0.1–1.0)
NEUTROS ABS: 2.6 10*3/uL (ref 1.7–7.7)
Neutrophils Relative %: 45 % (ref 43–77)
PLATELETS: 82 10*3/uL — AB (ref 150–400)
RBC: 4.71 MIL/uL (ref 3.87–5.11)
RDW: 13.1 % (ref 11.5–15.5)
Smear Review: DECREASED
WBC: 6 10*3/uL (ref 4.0–10.5)

## 2013-11-15 LAB — URINE MICROSCOPIC-ADD ON

## 2013-11-15 LAB — HEPATIC FUNCTION PANEL
ALBUMIN: 3.8 g/dL (ref 3.5–5.2)
ALK PHOS: 77 U/L (ref 39–117)
ALT: 6 U/L (ref 0–35)
AST: 18 U/L (ref 0–37)
Bilirubin, Direct: <0.2 mg/dL (ref 0.0–0.3)
TOTAL PROTEIN: 7.3 g/dL (ref 6.0–8.3)
Total Bilirubin: 0.2 mg/dL — ABNORMAL LOW (ref 0.3–1.2)

## 2013-11-15 LAB — URINALYSIS, ROUTINE W REFLEX MICROSCOPIC
Bilirubin Urine: NEGATIVE
Glucose, UA: NEGATIVE mg/dL
Ketones, ur: NEGATIVE mg/dL
Leukocytes, UA: NEGATIVE
NITRITE: NEGATIVE
PROTEIN: NEGATIVE mg/dL
UROBILINOGEN UA: 0.2 mg/dL (ref 0.0–1.0)
pH: 6 (ref 5.0–8.0)

## 2013-11-15 LAB — BASIC METABOLIC PANEL
ANION GAP: 14 (ref 5–15)
BUN: 12 mg/dL (ref 6–23)
CALCIUM: 9.1 mg/dL (ref 8.4–10.5)
CO2: 22 mEq/L (ref 19–32)
CREATININE: 0.71 mg/dL (ref 0.50–1.10)
Chloride: 101 mEq/L (ref 96–112)
GFR calc Af Amer: >90 mL/min (ref >90)
GLUCOSE: 90 mg/dL (ref 70–99)
Potassium: 3.8 mEq/L (ref 3.7–5.3)
SODIUM: 137 meq/L (ref 137–147)

## 2013-11-15 LAB — PREGNANCY, URINE: PREG TEST UR: NEGATIVE

## 2013-11-15 LAB — LIPASE, BLOOD: Lipase: 21 U/L (ref 11–59)

## 2013-11-15 MED ORDER — ONDANSETRON HCL 4 MG/2ML IJ SOLN
4.0000 mg | Freq: Once | INTRAMUSCULAR | Status: AC
  Administered 2013-11-15: 4 mg via INTRAVENOUS
  Filled 2013-11-15: qty 2

## 2013-11-15 MED ORDER — CIPROFLOXACIN HCL 500 MG PO TABS: 500.0000 mg | ORAL_TABLET | Freq: Two times a day (BID) | ORAL | Status: DC

## 2013-11-15 MED ORDER — SODIUM CHLORIDE 0.9 % IV BOLUS (SEPSIS)
2000.0000 mL | Freq: Once | INTRAVENOUS | Status: AC
  Administered 2013-11-15: 2000 mL via INTRAVENOUS

## 2013-11-15 MED ORDER — ONDANSETRON 4 MG PO TBDP: ORAL_TABLET | ORAL | Status: DC

## 2013-11-15 NOTE — ED Notes (Signed)
Patient asked about blood work made her aware that someone will be in here soon to let her know results.

## 2013-11-15 NOTE — ED Notes (Signed)
Vomiting and diarrhea for 4 days.  No vomiting in last 24 hours, but cont the diarrhea.  abd pain

## 2013-11-15 NOTE — Discharge Instructions (Signed)
If you are still having diarrhea in the next 48 hours you can choose to take the ciprofloxacin antibiotic. Otherwise if your symptoms are improving I would not take this antibiotic at all. Use the Zofran as needed for nausea and/or vomiting. If your symptoms worsen see your primary doctor or return to the ER.   Food Choices to Help Relieve Diarrhea When you have diarrhea, the foods you eat and your eating habits are very important. Choosing the right foods and drinks can help relieve diarrhea. Also, because diarrhea can last up to 7 days, you need to replace lost fluids and electrolytes (such as sodium, potassium, and chloride) in order to help prevent dehydration.  WHAT GENERAL GUIDELINES DO I NEED TO FOLLOW?  Slowly drink 1 cup (8 oz) of fluid for each episode of diarrhea. If you are getting enough fluid, your urine will be clear or pale yellow.  Eat starchy foods. Some good choices include white rice, white toast, pasta, low-fiber cereal, baked potatoes (without the skin), saltine crackers, and bagels.  Avoid large servings of any cooked vegetables.  Limit fruit to two servings per day. A serving is  cup or 1 small piece.  Choose foods with less than 2 g of fiber per serving.  Limit fats to less than 8 tsp (38 g) per day.  Avoid fried foods.  Eat foods that have probiotics in them. Probiotics can be found in certain dairy products.  Avoid foods and beverages that may increase the speed at which food moves through the stomach and intestines (gastrointestinal tract). Things to avoid include:  High-fiber foods, such as dried fruit, raw fruits and vegetables, nuts, seeds, and whole grain foods.  Spicy foods and high-fat foods.  Foods and beverages sweetened with high-fructose corn syrup, honey, or sugar alcohols such as xylitol, sorbitol, and mannitol. WHAT FOODS ARE RECOMMENDED? Grains White rice. White, [place], or pita breads (fresh or toasted), including plain rolls, buns, or  bagels. White pasta. Saltine, soda, or graham crackers. Pretzels. Low-fiber cereal. Cooked cereals made with water (such as cornmeal, farina, or cream cereals). Plain muffins. Matzo. Melba toast. Zwieback.  Vegetables Potatoes (without the skin). Strained tomato and vegetable juices. Most well-cooked and canned vegetables without seeds. Tender lettuce. Fruits Cooked or canned applesauce, apricots, cherries, fruit cocktail, grapefruit, peaches, pears, or plums. Fresh bananas, apples without skin, cherries, grapes, cantaloupe, grapefruit, peaches, oranges, or plums.  Meat and Other Protein Products Baked or boiled chicken. Eggs. Tofu. Fish. Seafood. Smooth peanut butter. Ground or well-cooked tender beef, ham, veal, lamb, pork, or poultry.  Dairy Plain yogurt, kefir, and unsweetened liquid yogurt. Lactose-free milk, buttermilk, or soy milk. Plain hard cheese. Beverages Sport drinks. Clear broths. Diluted fruit juices (except prune). Regular, caffeine-free sodas such as ginger ale. Water. Decaffeinated teas. Oral rehydration solutions. Sugar-free beverages not sweetened with sugar alcohols. Other Bouillon, broth, or soups made from recommended foods.  The items listed above may not be a complete list of recommended foods or beverages. Contact your dietitian for more options. WHAT FOODS ARE NOT RECOMMENDED? Grains Whole grain, whole wheat, bran, or rye breads, rolls, pastas, crackers, and cereals. Wild or brown rice. Cereals that contain more than 2 g of fiber per serving. Corn tortillas or taco shells. Cooked or dry oatmeal. Granola. Popcorn. Vegetables Raw vegetables. Cabbage, broccoli, Brussels sprouts, artichokes, baked beans, beet greens, corn, kale, legumes, peas, sweet potatoes, and yams. Potato skins. Cooked spinach and cabbage. Fruits Dried fruit, including raisins and dates. Raw fruits. Stewed or  dried prunes. Fresh apples with skin, apricots, mangoes, pears, raspberries, and strawberries.    Meat and Other Protein Products Chunky peanut butter. Nuts and seeds. Beans and lentils. Tomasa Blase.  Dairy High-fat cheeses. Milk, chocolate milk, and beverages made with milk, such as milk shakes. Cream. Ice cream. Sweets and Desserts Sweet rolls, doughnuts, and sweet breads. Pancakes and waffles. Fats and Oils Butter. Cream sauces. Margarine. Salad oils. Plain salad dressings. Olives. Avocados.  Beverages Caffeinated beverages (such as coffee, tea, soda, or energy drinks). Alcoholic beverages. Fruit juices with pulp. Prune juice. Soft drinks sweetened with high-fructose corn syrup or sugar alcohols. Other Coconut. Hot sauce. Chili powder. Mayonnaise. Gravy. Cream-based or milk-based soups.  The items listed above may not be a complete list of foods and beverages to avoid. Contact your dietitian for more information. WHAT SHOULD I DO IF I BECOME DEHYDRATED? Diarrhea can sometimes lead to dehydration. Signs of dehydration include dark urine and dry mouth and skin. If you think you are dehydrated, you should rehydrate with an oral rehydration solution. These solutions can be purchased at pharmacies, retail stores, or online.  Drink -1 cup (120-240 mL) of oral rehydration solution each time you have an episode of diarrhea. If drinking this amount makes your diarrhea worse, try drinking smaller amounts more often. For example, drink 1-3 tsp (5-15 mL) every 5-10 minutes.  A general rule for staying hydrated is to drink 1-2 L of fluid per day. Talk to your health care provider about the specific amount you should be drinking each day. Drink enough fluids to keep your urine clear or pale yellow. Document Released: 06/21/2003 Document Revised: 04/05/2013 Document Reviewed: 02/21/2013 [facility] Patient Information 2015 [place], [place]. This information is not intended to replace advice given to you by your health care provider. Make sure you discuss any questions you have with your health care  provider.   Diarrhea Diarrhea is frequent loose and watery bowel movements. It can cause you to feel weak and dehydrated. Dehydration can cause you to become tired and thirsty, have a dry mouth, and have decreased urination that often is dark yellow. Diarrhea is a sign of another problem, most often an infection that will not last long. In most cases, diarrhea typically lasts 2-3 days. However, it can last longer if it is a sign of something more serious. It is important to treat your diarrhea as directed by your caregiver to lessen or prevent future episodes of diarrhea. CAUSES  Some common causes include:  Gastrointestinal infections caused by viruses, bacteria, or parasites.  Food poisoning or food allergies.  Certain medicines, such as antibiotics, chemotherapy, and laxatives.  Artificial sweeteners and fructose.  Digestive disorders. HOME CARE INSTRUCTIONS  Ensure adequate fluid intake (hydration): Have 1 cup (8 oz) of fluid for each diarrhea episode. Avoid fluids that contain simple sugars or sports drinks, fruit juices, whole milk products, and sodas. Your urine should be clear or pale yellow if you are drinking enough fluids. Hydrate with an oral rehydration solution that you can purchase at pharmacies, retail stores, and online. You can prepare an oral rehydration solution at home by mixing the following ingredients together:   - tsp table salt.   tsp baking soda.   tsp salt substitute containing potassium chloride.  1  tablespoons sugar.  1 L (34 oz) of water.  Certain foods and beverages may increase the speed at which food moves through the gastrointestinal (GI) tract. These foods and beverages should be avoided and include:  Caffeinated and  alcoholic beverages.  High-fiber foods, such as raw fruits and vegetables, nuts, seeds, and whole grain breads and cereals.  Foods and beverages sweetened with sugar alcohols, such as xylitol, sorbitol, and mannitol.  Some  foods may be well tolerated and may help thicken stool including:  Starchy foods, such as rice, toast, pasta, low-sugar cereal, oatmeal, grits, baked potatoes, crackers, and bagels.  Bananas.  Applesauce.  Add probiotic-rich foods to help increase healthy bacteria in the GI tract, such as yogurt and fermented milk products.  Wash your hands well after each diarrhea episode.  Only take over-the-counter or prescription medicines as directed by your caregiver.  Take a warm bath to relieve any burning or pain from frequent diarrhea episodes. SEEK IMMEDIATE MEDICAL CARE IF:   You are unable to keep fluids down.  You have persistent vomiting.  You have blood in your stool, or your stools are black and tarry.  You do not urinate in 6-8 hours, or there is only a small amount of very dark urine.  You have abdominal pain that increases or localizes.  You have weakness, dizziness, confusion, or light-headedness.  You have a severe headache.  Your diarrhea gets worse or does not get better.  You have a fever or persistent symptoms for more than 2-3 days.  You have a fever and your symptoms suddenly get worse. MAKE SURE YOU:   Understand these instructions.  Will watch your condition.  Will get help right away if you are not doing well or get worse. Document Released: 03/21/2002 Document Revised: 08/15/2013 Document Reviewed: 12/07/2011 [facility] Patient Information 2015 [place], [place]. This information is not intended to replace advice given to you by your health care provider. Make sure you discuss any questions you have with your health care provider.

## 2013-11-15 NOTE — ED Provider Notes (Signed)
CSN: [id number]     Arrival date & time 11/15/13  1837 History   First MD Initiated Contact with Patient 11/15/13 1902     Chief Complaint  Patient presents with  . Emesis     (Consider location/radiation/quality/duration/timing/severity/associated sxs/prior Treatment) HPI 23 year old female with 4 days of nausea, vomiting, and diarrhea. Has not had any vomiting over the last 24 hours. Diarrhea is multiple loose, watery stools per day. Initially they were well formed since become almost pure water. She states there is a yellow tinge but no mucus. No blood. Has felt subjective chills but no documented fevers. Intermittently has epigastric abdominal pain. Initially felt like she had urinary frequency before this started and is wondering if she has a UTI. Denies dysuria. She recently traveled to [place] and came back about 2 weeks ago.  Past Medical History  Diagnosis Date  . Genital warts   . Vaginal discharge 03/08/2013  . Yeast infection 03/23/2013  . Contraceptive management 03/23/2013  . Miscarriage    Past Surgical History  Procedure Laterality Date  . Ankle fracture surgery     Family History  Problem Relation Age of Onset  . Cancer Paternal Grandmother    History  Substance Use Topics  . Smoking status: Never Smoker   . Smokeless tobacco: Never Used  . Alcohol Use: No   OB History   Grav Para Term Preterm Abortions TAB SAB Ect Mult Living   1    1  1    0     Review of Systems  Constitutional: Positive for chills. Negative for fever.  Gastrointestinal: Positive for nausea, vomiting, abdominal pain and diarrhea. Negative for blood in stool.  Genitourinary: Positive for frequency. Negative for dysuria.  All other systems reviewed and are negative.     Allergies  Review of patient's allergies indicates no known allergies.  Home Medications   Prior to Admission medications   Medication Sig Start Date End Date Taking? Authorizing Provider  bismuth subsalicylate  (PEPTO BISMOL) 262 MG chewable tablet Chew 524 mg by mouth as needed for indigestion or diarrhea or loose stools.   Yes Historical Provider, MD  fluconazole (DIFLUCAN) 150 MG tablet Take 1 tablet (150 mg total) by mouth once. 05/27/13   Adline PotterJennifer A Griffin, NP   BP 124/82  Pulse 89  Temp(Src) 98.8 F (37.1 C) (Oral)  Resp 16  LMP 10/07/2013  Breastfeeding? No Physical Exam  Nursing note and vitals reviewed. Constitutional: She is oriented to person, place, and time. She appears well-developed and well-nourished. No distress.  HENT:  Head: Normocephalic and atraumatic.  Right Ear: External ear normal.  Left Ear: External ear normal.  Nose: Nose normal.  Eyes: Right eye exhibits no discharge. Left eye exhibits no discharge.  Cardiovascular: Normal rate, regular rhythm and normal heart sounds.   Pulmonary/Chest: Effort normal and breath sounds normal.  Abdominal: Soft. She exhibits no distension. There is tenderness in the right upper quadrant, epigastric area and left upper quadrant.  Neurological: She is alert and oriented to person, place, and time.  Skin: Skin is warm and dry.    ED Course  Procedures (including critical care time) Labs Review Labs Reviewed  URINALYSIS, ROUTINE W REFLEX MICROSCOPIC - Abnormal; Notable for the following:    Specific Gravity, Urine >1.030 (*)    Hgb urine dipstick MODERATE (*)    All other components within normal limits  CBC WITH DIFFERENTIAL - Abnormal; Notable for the following:    Platelets 82 (*)  Monocytes Relative 13 (*)    All other components within normal limits  URINE MICROSCOPIC-ADD ON - Abnormal; Notable for the following:    Squamous Epithelial / LPF MANY (*)    Bacteria, UA MANY (*)    Crystals CA OXALATE CRYSTALS (*)    All other components within normal limits  HEPATIC FUNCTION PANEL - Abnormal; Notable for the following:    Total Bilirubin 0.2 (*)    All other components within normal limits  PREGNANCY, URINE  BASIC  METABOLIC PANEL  LIPASE, BLOOD    Imaging Review No results found.   EKG Interpretation None      MDM   Final diagnoses:  Gastroenteritis    Patient has a benign exam and benign workup here. No significant electrolyte abnormality. Patient has persistent nausea but no vomiting. Could be traveler's diarrhea, blood be a little atypical given the length of time since traveled to [place]. However she still having large volume diarrhea with some yellow stools I will prescribe Cipro and if her symptoms do not improve in the next 48 hours she is instructed to take this. We'll take Tylenol and ibuprofen for pain.    Audree Camel, MD 11/15/13 2114

## 2014-01-18 ENCOUNTER — Encounter: Payer: Self-pay | Admitting: Adult Health

## 2014-01-18 ENCOUNTER — Ambulatory Visit (INDEPENDENT_AMBULATORY_CARE_PROVIDER_SITE_OTHER): Payer: Medicaid Other | Admitting: Adult Health

## 2014-01-18 VITALS — BP 110/52 | Temp 98.3°F | Ht 61.0 in | Wt 142.5 lb

## 2014-01-18 DIAGNOSIS — Z3490 Encounter for supervision of normal pregnancy, unspecified, unspecified trimester: Secondary | ICD-10-CM | POA: Insufficient documentation

## 2014-01-18 DIAGNOSIS — Z3201 Encounter for pregnancy test, result positive: Secondary | ICD-10-CM | POA: Insufficient documentation

## 2014-01-18 DIAGNOSIS — N926 Irregular menstruation, unspecified: Secondary | ICD-10-CM

## 2014-01-18 DIAGNOSIS — R350 Frequency of micturition: Secondary | ICD-10-CM

## 2014-01-18 HISTORY — DX: Encounter for supervision of normal pregnancy, unspecified, unspecified trimester: Z34.90

## 2014-01-18 HISTORY — DX: Irregular menstruation, unspecified: N92.6

## 2014-01-18 HISTORY — DX: Encounter for pregnancy test, result positive: Z32.01

## 2014-01-18 HISTORY — DX: Frequency of micturition: R35.0

## 2014-01-18 LAB — POCT URINALYSIS DIPSTICK
Blood, UA: NEGATIVE
Glucose, UA: NEGATIVE
LEUKOCYTES UA: NEGATIVE
Nitrite, UA: NEGATIVE
Protein, UA: NEGATIVE

## 2014-01-18 LAB — POCT URINE PREGNANCY: Preg Test, Ur: POSITIVE

## 2014-01-18 MED ORDER — PRENATAL PLUS 27-1 MG PO TABS: 1.0000 | ORAL_TABLET | Freq: Every day | ORAL | Status: DC

## 2014-01-18 NOTE — Progress Notes (Signed)
Subjective:     Patient ID: Anna Hartman, female   DOB: 08/28/1990, 23 y.o.   MRN: [id number]  HPI Anna Hartman is a 23 year old white female in complaining of urinary frequency and missed period, has also noticed she is tired and breast are tender.  Review of Systems See HPI Reviewed past medical,surgical, social and family history. Reviewed medications and allergies.     Objective:   Physical Exam BP 110/52  Temp(Src) 98.3 F (36.8 C)  Ht 5\' 1"  (1.549 m)  Wt 142 lb 8 oz (64.638 kg)  BMI 26.94 kg/m2  LMP 10/07/2013+UPT urine negative,Skin warm and dry.Pelvic: external genitalia is normal in appearance, vagina: scant discharge without odor, cervix:smooth and bulbous, uterus: about 9 week size, non tender, FHR 169 by doppler, adnexa: no masses or tenderness noted.   She seemed surprised over +UPT.  Assessment:     +UPT Pregnant   Urinary frequency Missed period    Plan:     Rx prenatal plus #30 1 daily with  11 refills Return in 6-7 days for dating US and new ob with me(EDD about 08/24/14 by size) Review handout on first trimester Get medicaid

## 2014-01-18 NOTE — Patient Instructions (Signed)
First Trimester of Pregnancy The first trimester of pregnancy is from week 1 until the end of week 12 (months 1 through 3). A week after a sperm fertilizes an egg, the egg will implant on the wall of the uterus. This embryo will begin to develop into a baby. Genes from you and your partner are forming the baby. The female genes determine whether the baby is a boy or a girl. At 6-8 weeks, the eyes and face are formed, and the heartbeat can be seen on ultrasound. At the end of 12 weeks, all the baby's organs are formed.  Now that you are pregnant, you will want to do everything you can to have a healthy baby. Two of the most important things are to get good prenatal care and to follow your health care provider's instructions. Prenatal care is all the medical care you receive before the baby's birth. This care will help prevent, find, and treat any problems during the pregnancy and childbirth. BODY CHANGES Your body goes through many changes during pregnancy. The changes vary from woman to woman.   You may gain or lose a couple of pounds at first.  You may feel sick to your stomach (nauseous) and throw up (vomit). If the vomiting is uncontrollable, call your health care provider.  You may tire easily.  You may develop headaches that can be relieved by medicines approved by your health care provider.  You may urinate more often. Painful urination may mean you have a bladder infection.  You may develop heartburn as a result of your pregnancy.  You may develop constipation because certain hormones are causing the muscles that push waste through your intestines to slow down.  You may develop hemorrhoids or swollen, bulging veins (varicose veins).  Your breasts may begin to grow larger and become tender. Your nipples may stick out more, and the tissue that surrounds them (areola) may become darker.  Your gums may bleed and may be sensitive to brushing and flossing.  Dark spots or blotches (chloasma,  mask of pregnancy) may develop on your face. This will likely fade after the baby is born.  Your menstrual periods will stop.  You may have a loss of appetite.  You may develop cravings for certain kinds of food.  You may have changes in your emotions from day to day, such as being excited to be pregnant or being concerned that something may go wrong with the pregnancy and baby.  You may have more vivid and strange dreams.  You may have changes in your hair. These can include thickening of your hair, rapid growth, and changes in texture. Some women also have hair loss during or after pregnancy, or hair that feels dry or thin. Your hair will most likely return to normal after your baby is born. WHAT TO EXPECT AT YOUR PRENATAL VISITS During a routine prenatal visit:  You will be weighed to make sure you and the baby are growing normally.  Your blood pressure will be taken.  Your abdomen will be measured to track your baby's growth.  The fetal heartbeat will be listened to starting around week 10 or 12 of your pregnancy.  Test results from any previous visits will be discussed. Your health care provider may ask you:  How you are feeling.  If you are feeling the baby move.  If you have had any abnormal symptoms, such as leaking fluid, bleeding, severe headaches, or abdominal cramping.  If you have any questions. Other tests   that may be performed during your first trimester include:  Blood tests to find your blood type and to check for the presence of any previous infections. They will also be used to check for low iron levels (anemia) and Rh antibodies. Later in the pregnancy, blood tests for diabetes will be done along with other tests if problems develop.  Urine tests to check for infections, diabetes, or protein in the urine.  An ultrasound to confirm the proper growth and development of the baby.  An amniocentesis to check for possible genetic problems.  Fetal screens for  spina bifida and Down syndrome.  You may need other tests to make sure you and the baby are doing well. HOME CARE INSTRUCTIONS  Medicines  Follow your health care provider's instructions regarding medicine use. Specific medicines may be either safe or unsafe to take during pregnancy.  Take your prenatal vitamins as directed.  If you develop constipation, try taking a stool softener if your health care provider approves. Diet  Eat regular, well-balanced meals. Choose a variety of foods, such as meat or vegetable-based protein, fish, milk and low-fat dairy products, vegetables, fruits, and whole grain breads and cereals. Your health care provider will help you determine the amount of weight gain that is right for you.  Avoid raw meat and uncooked cheese. These carry germs that can cause birth defects in the baby.  Eating four or five small meals rather than three large meals a day may help relieve nausea and vomiting. If you start to feel nauseous, eating a few soda crackers can be helpful. Drinking liquids between meals instead of during meals also seems to help nausea and vomiting.  If you develop constipation, eat more high-fiber foods, such as fresh vegetables or fruit and whole grains. Drink enough fluids to keep your urine clear or pale yellow. Activity and Exercise  Exercise only as directed by your health care provider. Exercising will help you:  Control your weight.  Stay in shape.  Be prepared for labor and delivery.  Experiencing pain or cramping in the lower abdomen or low back is a good sign that you should stop exercising. Check with your health care provider before continuing normal exercises.  Try to avoid standing for long periods of time. Move your legs often if you must stand in one place for a long time.  Avoid heavy lifting.  Wear low-heeled shoes, and practice good posture.  You may continue to have sex unless your health care provider directs you  otherwise. Relief of Pain or Discomfort  Wear a good support bra for breast tenderness.   Take warm sitz baths to soothe any pain or discomfort caused by hemorrhoids. Use hemorrhoid cream if your health care provider approves.   Rest with your legs elevated if you have leg cramps or low back pain.  If you develop varicose veins in your legs, wear support hose. Elevate your feet for 15 minutes, 3-4 times a day. Limit salt in your diet. Prenatal Care  Schedule your prenatal visits by the twelfth week of pregnancy. They are usually scheduled monthly at first, then more often in the last 2 months before delivery.  Write down your questions. Take them to your prenatal visits.  Keep all your prenatal visits as directed by your health care provider. Safety  Wear your seat belt at all times when driving.  Make a list of emergency phone numbers, including numbers for family, friends, the hospital, and police and fire departments. General Tips    Ask your health care provider for a referral to a local prenatal education class. Begin classes no later than at the beginning of month 6 of your pregnancy.  Ask for help if you have counseling or nutritional needs during pregnancy. Your health care provider can offer advice or refer you to specialists for help with various needs.  Do not use hot tubs, steam rooms, or saunas.  Do not douche or use tampons or scented sanitary pads.  Do not cross your legs for long periods of time.  Avoid cat litter boxes and soil used by cats. These carry germs that can cause birth defects in the baby and possibly loss of the fetus by miscarriage or stillbirth.  Avoid all smoking, herbs, alcohol, and medicines not prescribed by your health care provider. Chemicals in these affect the formation and growth of the baby.  Schedule a dentist appointment. At home, brush your teeth with a soft toothbrush and be gentle when you floss. SEEK MEDICAL CARE IF:   You have  dizziness.  You have mild pelvic cramps, pelvic pressure, or nagging pain in the abdominal area.  You have persistent nausea, vomiting, or diarrhea.  You have a bad smelling vaginal discharge.  You have pain with urination.  You notice increased swelling in your face, hands, legs, or ankles. SEEK IMMEDIATE MEDICAL CARE IF:   You have a fever.  You are leaking fluid from your vagina.  You have spotting or bleeding from your vagina.  You have severe abdominal cramping or pain.  You have rapid weight gain or loss.  You vomit blood or material that looks like coffee grounds.  You are exposed to [place] measles and have never had them.  You are exposed to fifth disease or chickenpox.  You develop a severe headache.  You have shortness of breath.  You have any kind of trauma, such as from a fall or a car accident. Document Released: 03/25/2001 Document Revised: 08/15/2013 Document Reviewed: 02/08/2013 [facility] Patient Information 2015 [place], [place]. This information is not intended to replace advice given to you by your health care provider. Make sure you discuss any questions you have with your health care provider. Take prenatal vitamins Return in 6 days for US and new ob

## 2014-01-24 ENCOUNTER — Other Ambulatory Visit: Payer: Self-pay | Admitting: Obstetrics & Gynecology

## 2014-01-24 DIAGNOSIS — O3680X0 Pregnancy with inconclusive fetal viability, not applicable or unspecified: Secondary | ICD-10-CM

## 2014-01-25 ENCOUNTER — Encounter: Payer: Self-pay | Admitting: Adult Health

## 2014-01-25 ENCOUNTER — Ambulatory Visit (INDEPENDENT_AMBULATORY_CARE_PROVIDER_SITE_OTHER): Payer: Medicaid Other | Admitting: Adult Health

## 2014-01-25 ENCOUNTER — Other Ambulatory Visit (HOSPITAL_COMMUNITY)
Admission: RE | Admit: 2014-01-25 | Discharge: 2014-01-25 | Disposition: A | Discharge status: Home / Self Care | Payer: Medicaid Other | Source: Ambulatory Visit | Attending: Adult Health | Admitting: Adult Health

## 2014-01-25 ENCOUNTER — Ambulatory Visit (INDEPENDENT_AMBULATORY_CARE_PROVIDER_SITE_OTHER): Payer: Medicaid Other

## 2014-01-25 ENCOUNTER — Other Ambulatory Visit: Payer: Self-pay | Admitting: Adult Health

## 2014-01-25 VITALS — BP 104/60 | Wt 141.0 lb

## 2014-01-25 DIAGNOSIS — Z1389 Encounter for screening for other disorder: Secondary | ICD-10-CM

## 2014-01-25 DIAGNOSIS — Z3481 Encounter for supervision of other normal pregnancy, first trimester: Secondary | ICD-10-CM

## 2014-01-25 DIAGNOSIS — Z36 Encounter for antenatal screening of mother: Secondary | ICD-10-CM

## 2014-01-25 DIAGNOSIS — Z130 Encounter for screening for diseases of the blood and blood-forming organs and certain disorders involving the immune mechanism: Secondary | ICD-10-CM

## 2014-01-25 DIAGNOSIS — Z0283 Encounter for blood-alcohol and blood-drug test: Secondary | ICD-10-CM

## 2014-01-25 DIAGNOSIS — O3680X0 Pregnancy with inconclusive fetal viability, not applicable or unspecified: Secondary | ICD-10-CM

## 2014-01-25 DIAGNOSIS — Z3400 Encounter for supervision of normal first pregnancy, unspecified trimester: Secondary | ICD-10-CM | POA: Insufficient documentation

## 2014-01-25 DIAGNOSIS — Z113 Encounter for screening for infections with a predominantly sexual mode of transmission: Secondary | ICD-10-CM | POA: Diagnosis present

## 2014-01-25 DIAGNOSIS — Z1329 Encounter for screening for other suspected endocrine disorder: Secondary | ICD-10-CM

## 2014-01-25 DIAGNOSIS — Z3682 Encounter for antenatal screening for nuchal translucency: Secondary | ICD-10-CM

## 2014-01-25 DIAGNOSIS — Z3491 Encounter for supervision of normal pregnancy, unspecified, first trimester: Secondary | ICD-10-CM

## 2014-01-25 DIAGNOSIS — Z01419 Encounter for gynecological examination (general) (routine) without abnormal findings: Secondary | ICD-10-CM | POA: Diagnosis not present

## 2014-01-25 DIAGNOSIS — Z114 Encounter for screening for human immunodeficiency virus [HIV]: Secondary | ICD-10-CM

## 2014-01-25 DIAGNOSIS — Z331 Pregnant state, incidental: Secondary | ICD-10-CM

## 2014-01-25 DIAGNOSIS — Z0184 Encounter for antibody response examination: Secondary | ICD-10-CM

## 2014-01-25 DIAGNOSIS — Z3490 Encounter for supervision of normal pregnancy, unspecified, unspecified trimester: Secondary | ICD-10-CM

## 2014-01-25 HISTORY — DX: Encounter for supervision of normal pregnancy, unspecified, first trimester: Z34.91

## 2014-01-25 LAB — POCT URINALYSIS DIPSTICK
GLUCOSE UA: NEGATIVE
Nitrite, UA: NEGATIVE
PROTEIN UA: NEGATIVE

## 2014-01-25 NOTE — Progress Notes (Signed)
Subjective:  Anna Hartman is a 23 y.o. 122P0010 Caucasian female at 2029w6d by US being seen today for her first obstetrical visit.  Her obstetrical history is significant for recent miscarriage.  Pregnancy history fully reviewed.  Patient reports no complaints. Denies vb, cramping, uti s/s, abnormal/malodorous vag d/c, or vulvovaginal itching/irritation.  BP 104/60  Wt 141 lb (63.957 kg)  LMP 10/07/2013  HISTORY: OB History  Gravida Para Term Preterm AB SAB TAB Ectopic Multiple Living  2    1 1     0    # Outcome Date GA Lbr Len/2nd Weight Sex Delivery Anes PTL Lv  2 CUR           1 SAB 03/23/13             Comments: System Generated. Please review and update pregnancy details.     Past Medical History  Diagnosis Date  . Genital warts   . Vaginal discharge 03/08/2013  . Yeast infection 03/23/2013  . Contraceptive management 03/23/2013  . Miscarriage   . Positive pregnancy test 01/18/2014  . Pregnant 01/18/2014  . Urinary frequency 01/18/2014  . Missed periods 01/18/2014  . Supervision of normal pregnancy in first trimester 01/25/2014   Past Surgical History  Procedure Laterality Date  . Ankle fracture surgery     Family History  Problem Relation Age of Onset  . Cancer Paternal Grandmother   . COPD Maternal Grandmother   . Diabetes Father     Exam   System:     General: Well developed & nourished, no acute distress   Skin: Warm & dry, normal coloration and turgor, no rashes   Neurologic: Alert & oriented, normal mood   Cardiovascular: Regular rate & rhythm   Respiratory: Effort & rate normal, LCTAB, acyanotic   Abdomen: Soft, non tender   Extremities: normal strength, tone                                  Thyroid noraml Pelvic Exam:    Perineum: Normal perineum   Vulva: Normal, no lesions   Vagina:  Normal mucosa, normal discharge   Cervix: Normal, appears closed   Uterus: Normal size/shape/contour for GA   Thin prep pap smear with GC/CHL performed FHR:165  via US   Assessment:   Pregnancy: G2P0010 Patient Active Problem List   Diagnosis Date Noted  . Supervision of normal pregnancy in first trimester 01/25/2014  . Positive pregnancy test 01/18/2014  . Pregnant 01/18/2014  . Urinary frequency 01/18/2014  . Missed periods 01/18/2014  . Yeast infection 03/23/2013  . Vaginal discharge 03/08/2013    7429w6d G2P0010 New OB visit     Plan:  Initial labs drawn Continue prenatal vitamins Problem list reviewed and updated Reviewed n/v relief measures and warning s/s to report Reviewed recommended weight gain based on pre-gravid BMI Encouraged well-balanced diet Genetic Screening discussed Integrated Screen: requested Cystic fibrosis screening discussed requested Ultrasound discussed; fetal survey: requested Follow up in 4 weeks for second IT  [facility] form done.  Adline PotterJennifer A. Quron Ruddy, NP 01/25/2014 5:13 PM

## 2014-01-25 NOTE — Progress Notes (Signed)
U/S-single active fetus, CRL c/w 12+6wks EDD 08/03/2014, anterior low-lying placenta Gr 0 noted, cx appears closed, bilateral adnexa appears WNL, pt desires NT/IT screening, NT-1.4754mm, NB present, FHR-165 bpm

## 2014-01-25 NOTE — Patient Instructions (Signed)
Follow up in 4 weeks for second IT

## 2014-01-26 LAB — CBC
HCT: 34 % — ABNORMAL LOW (ref 36.0–46.0)
Hemoglobin: 11.6 g/dL — ABNORMAL LOW (ref 12.0–15.0)
MCH: 27.3 pg (ref 26.0–34.0)
MCHC: 34.1 g/dL (ref 30.0–36.0)
MCV: 80 fL (ref 78.0–100.0)
PLATELETS: 194 10*3/uL (ref 150–400)
RBC: 4.25 MIL/uL (ref 3.87–5.11)
RDW: 14.9 % (ref 11.5–15.5)
WBC: 11.7 10*3/uL — ABNORMAL HIGH (ref 4.0–10.5)

## 2014-01-26 LAB — ABO AND RH: Rh Type: POSITIVE

## 2014-01-26 LAB — DRUG SCREEN, URINE, NO CONFIRMATION
Amphetamine Screen, Ur: NEGATIVE
BARBITURATE QUANT UR: NEGATIVE
BENZODIAZEPINES.: NEGATIVE
CREATININE, U: 118.7 mg/dL
Cocaine Metabolites: NEGATIVE
METHADONE: NEGATIVE
Marijuana Metabolite: NEGATIVE
Opiate Screen, Urine: NEGATIVE
PROPOXYPHENE: NEGATIVE
Phencyclidine (PCP): NEGATIVE

## 2014-01-26 LAB — URINALYSIS
Bilirubin Urine: NEGATIVE
Glucose, UA: NEGATIVE mg/dL
HGB URINE DIPSTICK: NEGATIVE
Ketones, ur: 40 mg/dL — AB
LEUKOCYTES UA: NEGATIVE
Nitrite: NEGATIVE
PH: 7 (ref 5.0–8.0)
PROTEIN: NEGATIVE mg/dL
Specific Gravity, Urine: 1.017 (ref 1.005–1.030)
Urobilinogen, UA: 0.2 mg/dL (ref 0.0–1.0)

## 2014-01-26 LAB — HIV ANTIBODY (ROUTINE TESTING W REFLEX): HIV 1&2 Ab, 4th Generation: NONREACTIVE

## 2014-01-26 LAB — OXYCODONE SCREEN, UA, RFLX CONFIRM: OXYCODONE SCRN UR: NEGATIVE ng/mL

## 2014-01-26 LAB — TSH: TSH: 1.299 u[IU]/mL (ref 0.350–4.500)

## 2014-01-26 LAB — ANTIBODY SCREEN: ANTIBODY SCREEN: NEGATIVE

## 2014-01-26 LAB — RPR

## 2014-01-26 LAB — HEPATITIS B SURFACE ANTIGEN: HEP B S AG: NEGATIVE

## 2014-01-26 LAB — SICKLE CELL SCREEN: SICKLE CELL SCREEN: NEGATIVE

## 2014-01-27 LAB — VARICELLA ZOSTER ANTIBODY, IGG: Varicella IgG: 63.92 Index (ref <135.00)

## 2014-01-27 LAB — URINE CULTURE
Colony Count: NO GROWTH
Organism ID, Bacteria: NO GROWTH

## 2014-01-27 LAB — RUBELLA SCREEN: RUBELLA: 1.97 {index} — AB (ref <0.90)

## 2014-01-30 ENCOUNTER — Encounter: Payer: Self-pay | Admitting: Adult Health

## 2014-01-30 LAB — CYTOLOGY - PAP

## 2014-01-31 LAB — MATERNAL SCREEN, INTEGRATED #1

## 2014-02-13 ENCOUNTER — Encounter: Payer: Self-pay | Admitting: Adult Health

## 2014-02-22 ENCOUNTER — Encounter: Payer: Self-pay | Admitting: Advanced Practice Midwife

## 2014-02-22 ENCOUNTER — Ambulatory Visit (INDEPENDENT_AMBULATORY_CARE_PROVIDER_SITE_OTHER): Payer: Medicaid Other | Admitting: *Deleted

## 2014-02-22 ENCOUNTER — Ambulatory Visit (INDEPENDENT_AMBULATORY_CARE_PROVIDER_SITE_OTHER): Payer: Medicaid Other | Admitting: Advanced Practice Midwife

## 2014-02-22 VITALS — BP 100/68 | Wt 142.0 lb

## 2014-02-22 DIAGNOSIS — Z23 Encounter for immunization: Secondary | ICD-10-CM

## 2014-02-22 DIAGNOSIS — Z3481 Encounter for supervision of other normal pregnancy, first trimester: Secondary | ICD-10-CM

## 2014-02-22 DIAGNOSIS — Z3491 Encounter for supervision of normal pregnancy, unspecified, first trimester: Secondary | ICD-10-CM

## 2014-02-22 DIAGNOSIS — Z331 Pregnant state, incidental: Secondary | ICD-10-CM

## 2014-02-22 DIAGNOSIS — Z3682 Encounter for antenatal screening for nuchal translucency: Secondary | ICD-10-CM

## 2014-02-22 DIAGNOSIS — Z1389 Encounter for screening for other disorder: Secondary | ICD-10-CM

## 2014-02-22 DIAGNOSIS — Z029 Encounter for administrative examinations, unspecified: Secondary | ICD-10-CM

## 2014-02-22 LAB — POCT URINALYSIS DIPSTICK
Glucose, UA: NEGATIVE
Ketones, UA: NEGATIVE
Leukocytes, UA: NEGATIVE
NITRITE UA: NEGATIVE
PROTEIN UA: NEGATIVE
RBC UA: NEGATIVE

## 2014-02-22 MED ORDER — RELNATE DHA 28-1-200 MG PO CAPS
1.0000 | ORAL_CAPSULE | Freq: Once | ORAL | Status: DC
Start: 2014-02-22 — End: 2014-03-08

## 2014-02-22 NOTE — Patient Instructions (Signed)
Constipation Constipation is when a person has fewer than three bowel movements a week, has difficulty having a bowel movement, or has stools that are dry, hard, or larger than normal. As people grow older, constipation is more common. If you try to fix constipation with medicines that make you have a bowel movement (laxatives), the problem may get worse. Long-term laxative use may cause the muscles of the colon to become weak. A low-fiber diet, not taking in enough fluids, and taking certain medicines may make constipation worse.  CAUSES   Certain medicines, such as antidepressants, pain medicine, iron supplements, antacids, and water pills.   Certain diseases, such as diabetes, irritable bowel syndrome (IBS), thyroid disease, or depression.   Not drinking enough water.   Not eating enough fiber-rich foods.   Stress or travel.   Lack of physical activity or exercise.   Ignoring the urge to have a bowel movement.   Using laxatives too much.   Pregnancy SIGNS AND SYMPTOMS   Having fewer than three bowel movements a week.   Straining to have a bowel movement.   Having stools that are hard, dry, or larger than normal.   Feeling full or bloated.   Pain in the lower abdomen.   Not feeling relief after having a bowel movement.  DIAGNOSIS  Your health care provider will take a medical history and perform a physical exam. Further testing may be done for severe constipation. Some tests may include:  A barium enema X-ray to examine your rectum, colon, and, sometimes, your small intestine.   A sigmoidoscopy to examine your lower colon.   A colonoscopy to examine your entire colon. TREATMENT  Treatment will depend on the severity of your constipation and what is causing it. Some dietary treatments include drinking more fluids and eating more fiber-rich foods. Lifestyle treatments may include regular exercise. If these diet and lifestyle recommendations do not help, your  health care provider may recommend taking over-the-counter laxative medicines to help you have bowel movements.   During pregnancy, you make take over the counter stool softeners every night.  You may use Miralax for severe constipation  HOME CARE INSTRUCTIONS   Eat foods that have a lot of fiber, such as fruits, vegetables, whole grains, and beans.  Limit foods high in fat and processed sugars, such as french fries, hamburgers, cookies, candies, and soda.   A fiber supplement may be added to your diet if you cannot get enough fiber from foods.   Drink enough fluids to keep your urine clear or pale yellow.   Exercise regularly or as directed by your health care provider.   Go to the restroom when you have the urge to go. Do not hold it.   Only take over-the-counter or prescription medicines as directed by your health care provider. Do not take other medicines for constipation without talking to your health care provider first.  SEEK IMMEDIATE MEDICAL CARE IF:   You have bright red blood in your stool.   Your constipation lasts for more than 4 days or gets worse.   You have abdominal or rectal pain.   You have thin, pencil-like stools.   You have unexplained weight loss. MAKE SURE YOU:   Understand these instructions.  Will watch your condition.  Will get help right away if you are not doing well or get worse. Document Released: 12/28/2003 Document Revised: 04/05/2013 Document Reviewed: 01/10/2013 [facility] Patient Information 2015 [place], [place]. This information is not intended to replace advice given  to you by your health care provider. Make sure you discuss any questions you have with your health care provider.  

## 2014-02-22 NOTE — Progress Notes (Signed)
G2P0010 6589w6d Estimated Date of Delivery: 08/03/14  Last menstrual period 10/07/2013.   BP weight and urine results all reviewed and noted.  Please refer to the obstetrical flow sheet for the fundal height and fetal heart rate documentation:  Patient denies any bleeding and no rupture of membranes symptoms or regular contractions. Patient is without complaints other than constipation.  Recommend stool softners prn, increase fluids and fiber.  Miralax prn severe constipation All questions were answered.  Plan:  Continued routine obstetrical care, 2nd IT today  Follow up in 2 weeks for OB appointment, anatomy scan

## 2014-02-27 LAB — MATERNAL SCREEN, INTEGRATED #2
AFP MOM MAT SCREEN: 1.76
AFP, SERUM MAT SCREEN: 65.5 ng/mL
CALCULATED GESTATIONAL AGE MAT SCREEN: 16.7
Crown Rump Length: 65.9 mm
Estriol Mom: 0.78
Estriol, Free: 0.79 ng/mL
HCG, MOM MAT SCREEN: 1.06
INHIBIN A MOM MAT SCREEN: 0.82
Inhibin A Dimeric: 140 pg/mL
MSS Down Syndrome: <1:5000 {titer}
MSS Trisomy 18 Risk: <1:5000 {titer}
NT MOM MAT SCREEN: 1.04
NUCHAL TRANSLUCENCY MAT SCREEN 2: 1.54 mm
Number of fetuses: 1
PAPP-A MoM: 1.92
PAPP-A: 1425 ng/mL
Rish for ONTD: 1:1200 {titer}
hCG, Serum: 33.2 IU/mL

## 2014-03-08 ENCOUNTER — Other Ambulatory Visit: Payer: Self-pay | Admitting: Advanced Practice Midwife

## 2014-03-08 ENCOUNTER — Ambulatory Visit (INDEPENDENT_AMBULATORY_CARE_PROVIDER_SITE_OTHER): Payer: Medicaid Other | Admitting: Women's Health

## 2014-03-08 ENCOUNTER — Encounter: Payer: Self-pay | Admitting: Women's Health

## 2014-03-08 ENCOUNTER — Ambulatory Visit (INDEPENDENT_AMBULATORY_CARE_PROVIDER_SITE_OTHER): Payer: Medicaid Other

## 2014-03-08 VITALS — BP 102/66 | Wt 142.0 lb

## 2014-03-08 DIAGNOSIS — O09292 Supervision of pregnancy with other poor reproductive or obstetric history, second trimester: Secondary | ICD-10-CM

## 2014-03-08 DIAGNOSIS — Z3491 Encounter for supervision of normal pregnancy, unspecified, first trimester: Secondary | ICD-10-CM

## 2014-03-08 DIAGNOSIS — Z36 Encounter for antenatal screening of mother: Secondary | ICD-10-CM

## 2014-03-08 DIAGNOSIS — Z3689 Encounter for other specified antenatal screening: Secondary | ICD-10-CM

## 2014-03-08 DIAGNOSIS — Z1389 Encounter for screening for other disorder: Secondary | ICD-10-CM

## 2014-03-08 DIAGNOSIS — Z331 Pregnant state, incidental: Secondary | ICD-10-CM

## 2014-03-08 DIAGNOSIS — Z3402 Encounter for supervision of normal first pregnancy, second trimester: Secondary | ICD-10-CM

## 2014-03-08 LAB — POCT URINALYSIS DIPSTICK
Blood, UA: NEGATIVE
Glucose, UA: NEGATIVE
Ketones, UA: NEGATIVE
Leukocytes, UA: NEGATIVE
Nitrite, UA: NEGATIVE
PROTEIN UA: NEGATIVE

## 2014-03-08 MED ORDER — CONCEPT DHA 53.5-38-1 MG PO CAPS: 1.0000 | ORAL_CAPSULE | Freq: Every day | ORAL | Status: DC

## 2014-03-08 NOTE — Progress Notes (Signed)
U/S(18+6wks)-active fetus, meas c/w dates, fluid wnl, anterior Gr 0 placenta, cx appears closed 3.3cm, FHR-153 BPM, bilateral adnexa appears WNL, female fetus, no major abnl noted

## 2014-03-08 NOTE — Progress Notes (Signed)
Low-risk OB appointment G2P0010 2337w6d Estimated Date of Delivery: 08/03/14 BP 102/66 mmHg  Wt 142 lb (64.411 kg)  LMP 10/07/2013  BP, weight, and urine reviewed.  Refer to obstetrical flow sheet for FH & FHR.  Reports good fm.  Denies regular uc's, lof, vb, or uti s/s. No complaints. Reviewed today's normal anatomy u/s, ptl s/s, fm. Plan:  Continue routine obstetrical care  F/U in 4wks for OB appointment

## 2014-03-08 NOTE — Patient Instructions (Signed)
Second Trimester of Pregnancy The second trimester is from week 13 through week 28, months 4 through 6. The second trimester is often a time when you feel your best. Your body has also adjusted to being pregnant, and you begin to feel better physically. Usually, morning sickness has lessened or quit completely, you may have more energy, and you may have an increase in appetite. The second trimester is also a time when the fetus is growing rapidly. At the end of the sixth month, the fetus is about 9 inches long and weighs about 1 pounds. You will likely begin to feel the baby move (quickening) between 18 and 20 weeks of the pregnancy. BODY CHANGES Your body goes through many changes during pregnancy. The changes vary from woman to woman.   Your weight will continue to increase. You will notice your lower abdomen bulging out.  You may begin to get stretch marks on your hips, abdomen, and breasts.  You may develop headaches that can be relieved by medicines approved by your health care provider.  You may urinate more often because the fetus is pressing on your bladder.  You may develop or continue to have heartburn as a result of your pregnancy.  You may develop constipation because certain hormones are causing the muscles that push waste through your intestines to slow down.  You may develop hemorrhoids or swollen, bulging veins (varicose veins).  You may have back pain because of the weight gain and pregnancy hormones relaxing your joints between the bones in your pelvis and as a result of a shift in weight and the muscles that support your balance.  Your breasts will continue to grow and be tender.  Your gums may bleed and may be sensitive to brushing and flossing.  Dark spots or blotches (chloasma, mask of pregnancy) may develop on your face. This will likely fade after the baby is born.  A dark line from your belly button to the pubic area (linea nigra) may appear. This will likely fade  after the baby is born.  You may have changes in your hair. These can include thickening of your hair, rapid growth, and changes in texture. Some women also have hair loss during or after pregnancy, or hair that feels dry or thin. Your hair will most likely return to normal after your baby is born. WHAT TO EXPECT AT YOUR PRENATAL VISITS During a routine prenatal visit:  You will be weighed to make sure you and the fetus are growing normally.  Your blood pressure will be taken.  Your abdomen will be measured to track your baby's growth.  The fetal heartbeat will be listened to.  Any test results from the previous visit will be discussed. Your health care provider may ask you:  How you are feeling.  If you are feeling the baby move.  If you have had any abnormal symptoms, such as leaking fluid, bleeding, severe headaches, or abdominal cramping.  If you have any questions. Other tests that may be performed during your second trimester include:  Blood tests that check for:  Low iron levels (anemia).  Gestational diabetes (between 24 and 28 weeks).  Rh antibodies.  Urine tests to check for infections, diabetes, or protein in the urine.  An ultrasound to confirm the proper growth and development of the baby.  An amniocentesis to check for possible genetic problems.  Fetal screens for spina bifida and Down syndrome. HOME CARE INSTRUCTIONS   Avoid all smoking, herbs, alcohol, and unprescribed   drugs. These chemicals affect the formation and growth of the baby.  Follow your health care provider's instructions regarding medicine use. There are medicines that are either safe or unsafe to take during pregnancy.  Exercise only as directed by your health care provider. Experiencing uterine cramps is a good sign to stop exercising.  Continue to eat regular, healthy meals.  Wear a good support bra for breast tenderness.  Do not use hot tubs, steam rooms, or saunas.  Wear your  seat belt at all times when driving.  Avoid raw meat, uncooked cheese, cat litter boxes, and soil used by cats. These carry germs that can cause birth defects in the baby.  Take your prenatal vitamins.  Try taking a stool softener (if your health care provider approves) if you develop constipation. Eat more high-fiber foods, such as fresh vegetables or fruit and whole grains. Drink plenty of fluids to keep your urine clear or pale yellow.  Take warm sitz baths to soothe any pain or discomfort caused by hemorrhoids. Use hemorrhoid cream if your health care provider approves.  If you develop varicose veins, wear support hose. Elevate your feet for 15 minutes, 3-4 times a day. Limit salt in your diet.  Avoid heavy lifting, wear low heel shoes, and practice good posture.  Rest with your legs elevated if you have leg cramps or low back pain.  Visit your dentist if you have not gone yet during your pregnancy. Use a soft toothbrush to brush your teeth and be gentle when you floss.  A sexual relationship may be continued unless your health care provider directs you otherwise.  Continue to go to all your prenatal visits as directed by your health care provider. SEEK MEDICAL CARE IF:   You have dizziness.  You have mild pelvic cramps, pelvic pressure, or nagging pain in the abdominal area.  You have persistent nausea, vomiting, or diarrhea.  You have a bad smelling vaginal discharge.  You have pain with urination. SEEK IMMEDIATE MEDICAL CARE IF:   You have a fever.  You are leaking fluid from your vagina.  You have spotting or bleeding from your vagina.  You have severe abdominal cramping or pain.  You have rapid weight gain or loss.  You have shortness of breath with chest pain.  You notice sudden or extreme swelling of your face, hands, ankles, feet, or legs.  You have not felt your baby move in over an hour.  You have severe headaches that do not go away with  medicine.  You have vision changes. Document Released: 03/25/2001 Document Revised: 04/05/2013 Document Reviewed: 06/01/2012 [employer] Patient Information 2015 [employer]. This information is not intended to replace advice given to you by your health care provider. Make sure you discuss any questions you have with your health care provider.  

## 2014-03-14 ENCOUNTER — Telehealth: Payer: Self-pay | Admitting: Women's Health

## 2014-03-14 NOTE — Telephone Encounter (Signed)
Pt states she saw in my chart something that said supervision of pregnancy in first trimester and she is in her second trimester so she was concerned that that meant something was wrong.  I reassured her that that was just a diagnosis for a normal OB visit.  Pt verbalized understanding.

## 2014-03-23 ENCOUNTER — Telehealth: Payer: Self-pay | Admitting: *Deleted

## 2014-03-23 NOTE — Telephone Encounter (Signed)
Pt c/o itchy and sore throat, losing voice, cough, no fever.  Pt informed she can take OTC Robitussin, Lozenges, warm salt water, if no improvement call office back. Pt verbalized understanding.

## 2014-03-30 ENCOUNTER — Ambulatory Visit (INDEPENDENT_AMBULATORY_CARE_PROVIDER_SITE_OTHER): Payer: Medicaid Other | Admitting: Obstetrics & Gynecology

## 2014-03-30 ENCOUNTER — Encounter: Payer: Self-pay | Admitting: Obstetrics & Gynecology

## 2014-03-30 VITALS — BP 100/60 | Wt 147.0 lb

## 2014-03-30 DIAGNOSIS — Z3402 Encounter for supervision of normal first pregnancy, second trimester: Secondary | ICD-10-CM

## 2014-03-30 DIAGNOSIS — Z1389 Encounter for screening for other disorder: Secondary | ICD-10-CM

## 2014-03-30 DIAGNOSIS — Z331 Pregnant state, incidental: Secondary | ICD-10-CM

## 2014-03-30 LAB — POCT URINALYSIS DIPSTICK
Glucose, UA: NEGATIVE
Ketones, UA: NEGATIVE
Nitrite, UA: NEGATIVE
PROTEIN UA: NEGATIVE
RBC UA: NEGATIVE

## 2014-03-30 NOTE — Addendum Note (Signed)
Addended by: Criss AlvinePULLIAM, Addam Goeller G on: 03/30/2014 02:28 PM   Modules accepted: Orders

## 2014-03-30 NOTE — Progress Notes (Signed)
G2P0010 5957w0d Estimated Date of Delivery: 08/03/14  Blood pressure 100/60, weight 147 lb (66.679 kg), last menstrual period 10/07/2013.   BP weight and urine results all reviewed and noted.  Please refer to the obstetrical flow sheet for the fundal height and fetal heart rate documentation:  Patient reports good fetal movement, denies any bleeding and no rupture of membranes symptoms or regular contractions. Patient is without complaints. All questions were answered.  Plan:  Continued routine obstetrical care,   Follow up in 4 weeks for OB appointment, PN2 + ob visit

## 2014-04-05 ENCOUNTER — Encounter: Payer: Medicaid Other | Admitting: Women's Health

## 2014-04-14 NOTE — L&D Delivery Note (Cosign Needed)
Delivery Note After about 1 hour of pushing, At 7:16 PM a viable female was delivered via Vaginal, Spontaneous Delivery (Presentation: Middle Occiput Anterior).  APGAR: 8/9, ; weight  .  Pending.. 40 units of pitocin diluted in 1000cc LR was infused rapidly IV.  The placenta separated spontaneously and delivered via CCT and maternal pushing effort.  It was inspected and appears to be intact with a 3 VC.    Anesthesia: Epidural  Episiotomy: None Lacerations:  1st degree vaginal Suture Repair: n/a Est. Blood Loss (mL):  130  Mom to postpartum.  Baby to Couplet care / Skin to Skin   Delivery by Caryn BeeKevin, med student under my direct supervision.  CRESENZO-DISHMAN,Anna Hartman 08/03/2014, 7:42 PM

## 2014-04-18 ENCOUNTER — Telehealth: Payer: Self-pay | Admitting: *Deleted

## 2014-04-18 NOTE — Telephone Encounter (Signed)
Pt states that last night had a big meal and then went to bed and about 3am woke up with a lot of pressure in chest and felt like it was hard to catch her breath, threw up and then started feeling better.  She states that food was not digested good when threw it up.  Reports good FM, no cramping and no bleeding.  Advised pt to keep an eye on things and if it continued to happen to give us a call back, advised her to push fluids and make sure a chew food well when eating.  Pt verbalized understanding.

## 2014-04-26 ENCOUNTER — Other Ambulatory Visit: Payer: Medicaid Other

## 2014-04-26 ENCOUNTER — Encounter: Payer: Self-pay | Admitting: Advanced Practice Midwife

## 2014-04-26 ENCOUNTER — Ambulatory Visit (INDEPENDENT_AMBULATORY_CARE_PROVIDER_SITE_OTHER): Payer: Medicaid Other | Admitting: Advanced Practice Midwife

## 2014-04-26 VITALS — BP 100/70 | Wt 150.0 lb

## 2014-04-26 DIAGNOSIS — Z3402 Encounter for supervision of normal first pregnancy, second trimester: Secondary | ICD-10-CM

## 2014-04-26 DIAGNOSIS — Z114 Encounter for screening for human immunodeficiency virus [HIV]: Secondary | ICD-10-CM

## 2014-04-26 DIAGNOSIS — Z331 Pregnant state, incidental: Secondary | ICD-10-CM

## 2014-04-26 DIAGNOSIS — Z1389 Encounter for screening for other disorder: Secondary | ICD-10-CM

## 2014-04-26 DIAGNOSIS — Z3482 Encounter for supervision of other normal pregnancy, second trimester: Secondary | ICD-10-CM

## 2014-04-26 DIAGNOSIS — Z131 Encounter for screening for diabetes mellitus: Secondary | ICD-10-CM

## 2014-04-26 DIAGNOSIS — Z113 Encounter for screening for infections with a predominantly sexual mode of transmission: Secondary | ICD-10-CM

## 2014-04-26 DIAGNOSIS — Z0184 Encounter for antibody response examination: Secondary | ICD-10-CM

## 2014-04-26 LAB — POCT URINALYSIS DIPSTICK
Blood, UA: NEGATIVE
GLUCOSE UA: NEGATIVE
Ketones, UA: NEGATIVE
Leukocytes, UA: NEGATIVE
NITRITE UA: NEGATIVE
PROTEIN UA: NEGATIVE

## 2014-04-26 NOTE — Patient Instructions (Addendum)
1. Before your test, do not eat or drink anything for 8-10 hours prior to your  appointment (a small amount of water is allowed and you may take any medicines you normally take). Be sure to drink lots of water the day before. 2. When you arrive, your blood will be drawn for a 'fasting' blood sugar level.  Then you will be given a sweetened carbonated beverage to drink. You should  complete drinking this beverage within five minutes. After finishing the  beverage, you will have your blood drawn exactly 1 and 2 hours later. Having  your blood drawn on time is an important part of this test. A total of three blood  samples will be done. 3. The test takes approximately 2  hours. During the test, do not have anything to  eat or drink. Do not smoke, chew gum (not even sugarless gum) or use breath mints.  4. During the test you should remain close by and seated as much as possible and  avoid walking around. You may want to bring a book or something else to  occupy your time.  5. After your test, you may eat and drink as normal. You may want to bring a snack  to eat after the test is finished. Your provider will advise you as to the results of  this test and any follow-up if necessary  You will also be retested for syphilis, HIV and blood levels (anemia):  You were already tested in the first trimester, but [facility] recommends retesting.  Additionally, you will be tested for Type 2 Herpes. MOST people do not know that they have genital herpes, as only around 15% of people have outbreaks.  However, it is still transmittable to other people, including the baby (but only during the birth).  If you test positive for Type 2 Herpes, we place you on a medicine called acyclovir the last 6 weeks of your pregnancy to prevent transmission of the virus to the baby during the birth.    If your sugar test is positive for gestational diabetes, you will be given an phone call and further instructions discussed.   We typically do not call patients with positive herpes results, but will discuss it at your next appointment.  If you wish to know all of your test results before your next appointment, feel free to call the office, or look up your test results on Mychart.  (The range that the lab uses for normal values of the sugar test are not necessarily the range that is used for pregnant women; if your results are within the range, they are definitely normal.  However, if a value is deemed "high" by the lab, it may not be too high for a pregnant woman.  We will need to discuss the normal range if your value(s) fall in the "high" category).     Tdap Vaccine  It is recommended that you get the Tdap vaccine during the third trimester of EACH pregnancy to help protect your baby from getting pertussis (whooping cough)  27-36 weeks is the BEST time to do this so that you can pass the protection on to your baby. During pregnancy is better than after pregnancy, but if you are unable to get it during pregnancy it will be offered at the hospital.  You can get this vaccine at the health department or your family doctor, as well as some pharmacies.  Everyone who will be around your baby should also be up-to-date on   their vaccines. Adults (who are not pregnant) only need 1 dose of Tdap during adulthood.     Preterm Labor Information Preterm labor is when labor starts at less than 37 weeks of pregnancy. The normal length of a pregnancy is 39 to 41 weeks. CAUSES Often, there is no identifiable underlying cause as to why a woman goes into preterm labor. One of the most common known causes of preterm labor is infection. Infections of the uterus, cervix, vagina, amniotic sac, bladder, kidney, or even the lungs (pneumonia) can cause labor to start. Other suspected causes of preterm labor include:   Urogenital infections, such as yeast infections and bacterial vaginosis.   Uterine abnormalities (uterine shape, uterine  septum, fibroids, or bleeding from the placenta).   A cervix that has been operated on (it may fail to stay closed).   Malformations in the fetus.   Multiple gestations (twins, triplets, and so on).   Breakage of the amniotic sac.  RISK FACTORS  Having a previous history of preterm labor.   Having premature rupture of membranes (PROM).   Having a placenta that covers the opening of the cervix (placenta previa).   Having a placenta that separates from the uterus (placental abruption).   Having a cervix that is too weak to hold the fetus in the uterus (incompetent cervix).   Having too much fluid in the amniotic sac (polyhydramnios).   Taking illegal drugs or smoking while pregnant.   Not gaining enough weight while pregnant.   Being younger than 37 and older than 26 years old.   Having a low socioeconomic status.   Being African American. SYMPTOMS Signs and symptoms of preterm labor include:   Menstrual-like cramps, abdominal pain, or back pain.  Uterine contractions that are regular, as frequent as six in an hour, regardless of their intensity (may be mild or painful).  Contractions that start on the top of the uterus and spread down to the lower abdomen and back.   A sense of increased pelvic pressure.   A watery or bloody mucus discharge that comes from the vagina.  TREATMENT Depending on the length of the pregnancy and other circumstances, your health care provider may suggest bed rest. If necessary, there are medicines that can be given to stop contractions and to mature the fetal lungs. If labor happens before 34 weeks of pregnancy, a prolonged hospital stay may be recommended. Treatment depends on the condition of both you and the fetus.  WHAT SHOULD YOU DO IF YOU THINK YOU ARE IN PRETERM LABOR? Call your health care provider right away. You will need to go to the hospital to get checked immediately. HOW CAN YOU PREVENT PRETERM LABOR IN FUTURE  PREGNANCIES? You should:   Stop smoking if you smoke.  Maintain healthy weight gain and avoid chemicals and drugs that are not necessary.  Be watchful for any type of infection.  Inform your health care provider if you have a known history of preterm labor. Document Released: 06/21/2003 Document Revised: 12/01/2012 Document Reviewed: 05/03/2012 [facility] Patient Information 2015 [place], [place]. This information is not intended to replace advice given to you by your health care provider. Make sure you discuss any questions you have with your health care provider. Braxton Hicks Contractions Contractions of the uterus can occur throughout pregnancy. Contractions are not always a sign that you are in labor.  WHAT ARE BRAXTON HICKS CONTRACTIONS?  Contractions that occur before labor are called Braxton Hicks contractions, or false labor. Toward the end  of pregnancy (32-34 weeks), these contractions can develop more often and may become more forceful. This is not true labor because these contractions do not result in opening (dilatation) and thinning of the cervix. They are sometimes difficult to tell apart from true labor because these contractions can be forceful and people have different pain tolerances. You should not feel embarrassed if you go to the hospital with false labor. Sometimes, the only way to tell if you are in true labor is for your health care provider to look for changes in the cervix. If there are no prenatal problems or other health problems associated with the pregnancy, it is completely safe to be sent home with false labor and await the onset of true labor. HOW CAN YOU TELL THE DIFFERENCE BETWEEN TRUE AND FALSE LABOR? False Labor  The contractions of false labor are usually shorter and not as hard as those of true labor.   The contractions are usually irregular.   The contractions are often felt in the front of the lower abdomen and in the groin.   The contractions may  go away when you walk around or change positions while lying down.   The contractions get weaker and are shorter lasting as time goes on.   The contractions do not usually become progressively stronger, regular, and closer together as with true labor.  True Labor  Contractions in true labor last 30-70 seconds, become very regular, usually become more intense, and increase in frequency.   The contractions do not go away with walking.   The discomfort is usually felt in the top of the uterus and spreads to the lower abdomen and low back.   True labor can be determined by your health care provider with an exam. This will show that the cervix is dilating and getting thinner.  WHAT TO REMEMBER  Keep up with your usual exercises and follow other instructions given by your health care provider.   Take medicines as directed by your health care provider.   Keep your regular prenatal appointments.   Eat and drink lightly if you think you are going into labor.   If Braxton Hicks contractions are making you uncomfortable:   Change your position from lying down or resting to walking, or from walking to resting.   Sit and rest in a tub of warm water.   Drink 2-3 glasses of water. Dehydration may cause these contractions.   Do slow and deep breathing several times an hour.  WHEN SHOULD I SEEK IMMEDIATE MEDICAL CARE? Seek immediate medical care if:  Your contractions become stronger, more regular, and closer together.   You have fluid leaking or gushing from your vagina.   You have a fever.   You pass blood-tinged mucus.   You have vaginal bleeding.   You have continuous abdominal pain.   You have low back pain that you never had before.   You feel your baby's head pushing down and causing pelvic pressure.   Your baby is not moving as much as it used to.  Document Released: 03/31/2005 Document Revised: 04/05/2013 Document Reviewed: 01/10/2013 [facility]  Patient Information 2015 [place], [place]. This information is not intended to replace advice given to you by your health care provider. Make sure you discuss any questions you have with your health care provider.

## 2014-04-26 NOTE — Progress Notes (Signed)
G2P0010 8442w6d Estimated Date of Delivery: 08/03/14  Blood pressure 100/70, weight 150 lb (68.04 kg), last menstrual period 10/07/2013.   BP weight and urine results all reviewed and noted.  Please refer to the obstetrical flow sheet for the fundal height and fetal heart rate documentation:  Patient reports good fetal movement, denies any bleeding and no rupture of membranes symptoms or regular contractions. Patient is without complaints. All questions were answered.  Plan:  Continued routine obstetrical care, PN2 today  Follow up in 4 weeks for OB appointment,

## 2014-04-27 ENCOUNTER — Encounter: Payer: Medicaid Other | Admitting: Advanced Practice Midwife

## 2014-04-27 ENCOUNTER — Other Ambulatory Visit: Payer: Medicaid Other

## 2014-04-27 LAB — CBC
HEMATOCRIT: 35.8 % — AB (ref 36.0–46.0)
HEMOGLOBIN: 11.8 g/dL — AB (ref 12.0–15.0)
MCH: 28.2 pg (ref 26.0–34.0)
MCHC: 33 g/dL (ref 30.0–36.0)
MCV: 85.4 fL (ref 78.0–100.0)
MPV: 9.4 fL (ref 8.6–12.4)
Platelets: 181 10*3/uL (ref 150–400)
RBC: 4.19 MIL/uL (ref 3.87–5.11)
RDW: 14.8 % (ref 11.5–15.5)
WBC: 11.6 10*3/uL — ABNORMAL HIGH (ref 4.0–10.5)

## 2014-04-27 LAB — HSV 2 ANTIBODY, IGG

## 2014-04-27 LAB — GLUCOSE TOLERANCE, 2 HOURS W/ 1HR
Glucose, 1 hour: 119 mg/dL (ref 70–170)
Glucose, 2 hour: 114 mg/dL (ref 70–139)
Glucose, Fasting: 73 mg/dL (ref 70–99)

## 2014-04-27 LAB — HIV ANTIBODY (ROUTINE TESTING W REFLEX): HIV: NONREACTIVE

## 2014-04-27 LAB — ANTIBODY SCREEN: Antibody Screen: NEGATIVE

## 2014-04-27 LAB — RPR

## 2014-05-01 ENCOUNTER — Telehealth: Payer: Self-pay | Admitting: *Deleted

## 2014-05-01 NOTE — Telephone Encounter (Signed)
Pt informed that lab results were normal.

## 2014-05-02 ENCOUNTER — Encounter (HOSPITAL_COMMUNITY): Payer: Self-pay | Admitting: Emergency Medicine

## 2014-05-02 ENCOUNTER — Emergency Department (HOSPITAL_COMMUNITY)
Admission: EM | Admit: 2014-05-02 | Discharge: 2014-05-02 | Disposition: A | Discharge status: Home / Self Care | Payer: Medicaid Other | Attending: Emergency Medicine | Admitting: Emergency Medicine

## 2014-05-02 DIAGNOSIS — R110 Nausea: Secondary | ICD-10-CM | POA: Insufficient documentation

## 2014-05-02 DIAGNOSIS — Z8619 Personal history of other infectious and parasitic diseases: Secondary | ICD-10-CM | POA: Diagnosis not present

## 2014-05-02 DIAGNOSIS — R1013 Epigastric pain: Secondary | ICD-10-CM | POA: Diagnosis not present

## 2014-05-02 DIAGNOSIS — M546 Pain in thoracic spine: Secondary | ICD-10-CM | POA: Diagnosis not present

## 2014-05-02 DIAGNOSIS — Z8742 Personal history of other diseases of the female genital tract: Secondary | ICD-10-CM | POA: Insufficient documentation

## 2014-05-02 DIAGNOSIS — Z3402 Encounter for supervision of normal first pregnancy, second trimester: Secondary | ICD-10-CM

## 2014-05-02 DIAGNOSIS — Z79899 Other long term (current) drug therapy: Secondary | ICD-10-CM | POA: Insufficient documentation

## 2014-05-02 DIAGNOSIS — Z3A27 27 weeks gestation of pregnancy: Secondary | ICD-10-CM | POA: Insufficient documentation

## 2014-05-02 DIAGNOSIS — O9989 Other specified diseases and conditions complicating pregnancy, childbirth and the puerperium: Secondary | ICD-10-CM | POA: Insufficient documentation

## 2014-05-02 LAB — COMPREHENSIVE METABOLIC PANEL
ALBUMIN: 3.6 g/dL (ref 3.5–5.2)
ALT: 11 U/L (ref 0–35)
AST: 32 U/L (ref 0–37)
Alkaline Phosphatase: 89 U/L (ref 39–117)
Anion gap: 8 (ref 5–15)
BILIRUBIN TOTAL: 0.2 mg/dL — AB (ref 0.3–1.2)
BUN: 7 mg/dL (ref 6–23)
CO2: 23 mmol/L (ref 19–32)
Calcium: 9 mg/dL (ref 8.4–10.5)
Chloride: 105 mEq/L (ref 96–112)
Creatinine, Ser: 0.5 mg/dL (ref 0.50–1.10)
GFR calc Af Amer: >90 mL/min (ref >90)
Glucose, Bld: 96 mg/dL (ref 70–99)
Potassium: 3.4 mmol/L — ABNORMAL LOW (ref 3.5–5.1)
Sodium: 136 mmol/L (ref 135–145)
Total Protein: 7.4 g/dL (ref 6.0–8.3)

## 2014-05-02 LAB — URINALYSIS, ROUTINE W REFLEX MICROSCOPIC
Bilirubin Urine: NEGATIVE
GLUCOSE, UA: NEGATIVE mg/dL
HGB URINE DIPSTICK: NEGATIVE
Ketones, ur: NEGATIVE mg/dL
LEUKOCYTES UA: NEGATIVE
Nitrite: NEGATIVE
Protein, ur: NEGATIVE mg/dL
Specific Gravity, Urine: >1.03 — ABNORMAL HIGH (ref 1.005–1.030)
UROBILINOGEN UA: 0.2 mg/dL (ref 0.0–1.0)
pH: 6 (ref 5.0–8.0)

## 2014-05-02 LAB — CBC WITH DIFFERENTIAL/PLATELET
Basophils Absolute: 0 10*3/uL (ref 0.0–0.1)
Basophils Relative: 0 % (ref 0–1)
Eosinophils Absolute: 0.1 10*3/uL (ref 0.0–0.7)
Eosinophils Relative: 1 % (ref 0–5)
HEMATOCRIT: 32.5 % — AB (ref 36.0–46.0)
Hemoglobin: 10.7 g/dL — ABNORMAL LOW (ref 12.0–15.0)
Lymphocytes Relative: 16 % (ref 12–46)
Lymphs Abs: 2 10*3/uL (ref 0.7–4.0)
MCH: 28.1 pg (ref 26.0–34.0)
MCHC: 32.9 g/dL (ref 30.0–36.0)
MCV: 85.3 fL (ref 78.0–100.0)
MONOS PCT: 5 % (ref 3–12)
Monocytes Absolute: 0.7 10*3/uL (ref 0.1–1.0)
Neutro Abs: 9.9 10*3/uL — ABNORMAL HIGH (ref 1.7–7.7)
Neutrophils Relative %: 78 % — ABNORMAL HIGH (ref 43–77)
Platelets: 156 10*3/uL (ref 150–400)
RBC: 3.81 MIL/uL — ABNORMAL LOW (ref 3.87–5.11)
RDW: 13.9 % (ref 11.5–15.5)
WBC: 12.7 10*3/uL — AB (ref 4.0–10.5)

## 2014-05-02 LAB — LIPASE, BLOOD: Lipase: 24 U/L (ref 11–59)

## 2014-05-02 MED ORDER — PANTOPRAZOLE SODIUM 40 MG IV SOLR
40.0000 mg | Freq: Once | INTRAVENOUS | Status: DC
  Filled 2014-05-02: qty 40

## 2014-05-02 MED ORDER — ALUM & MAG HYDROXIDE-SIMETH 200-200-20 MG/5ML PO SUSP
30.0000 mL | Freq: Once | ORAL | Status: DC
  Filled 2014-05-02: qty 30

## 2014-05-02 NOTE — ED Provider Notes (Signed)
CSN: [id number]     Arrival date & time 05/02/14  0356 History   First MD Initiated Contact with Patient 05/02/14 [phone]     Chief Complaint  Patient presents with  . Abdominal Pain     (Consider location/radiation/quality/duration/timing/severity/associated sxs/prior Treatment) HPI Comments: Patient awoke from sleep with severe epigastric pain and nausea that radiates to her left upper back. Pain is constant for the past one hour. It is improving. Similar episode 2 weeks ago that resolved after his several minutes. Patient is [redacted] weeks pregnant with her first pregnancy. Denies any abdominal bleeding, contractions, gush of fluid. No problems with this pregnancy. Denies any history of ulcers or acid reflux. She denies any caffeine use. She denies any alcohol use. She denies any spicy food use. She still has her gallbladder and appendix. She denies any chest pain or shortness of breath.  The history is provided by the patient.    Past Medical History  Diagnosis Date  . Genital warts   . Vaginal discharge 03/08/2013  . Yeast infection 03/23/2013  . Contraceptive management 03/23/2013  . Miscarriage   . Positive pregnancy test 01/18/2014  . Pregnant 01/18/2014  . Urinary frequency 01/18/2014  . Missed periods 01/18/2014  . Supervision of normal pregnancy in first trimester 01/25/2014   Past Surgical History  Procedure Laterality Date  . Ankle fracture surgery     Family History  Problem Relation Age of Onset  . Cancer Paternal Grandmother   . COPD Maternal Grandmother   . Diabetes Father    History  Substance Use Topics  . Smoking status: Never Smoker   . Smokeless tobacco: Never Used  . Alcohol Use: No   OB History    Gravida Para Term Preterm AB TAB SAB Ectopic Multiple Living   2    1  1    0     Review of Systems  Constitutional: Negative for fever, activity change and appetite change.  HENT: Negative for congestion and rhinorrhea.   Eyes: Negative for visual disturbance.   Respiratory: Negative for cough, chest tightness and shortness of breath.   Cardiovascular: Negative for chest pain.  Gastrointestinal: Positive for nausea and abdominal pain. Negative for vomiting.  Genitourinary: Negative for dysuria, hematuria, vaginal bleeding and vaginal discharge.  Musculoskeletal: Positive for back pain. Negative for myalgias and arthralgias.  Skin: Negative for rash.  Neurological: Negative for dizziness, weakness and headaches.  A complete 10 system review of systems was obtained and all systems are negative except as noted in the HPI and PMH.      Allergies  Review of patient's allergies indicates no known allergies.  Home Medications   Prior to Admission medications   Medication Sig Start Date End Date Taking? Authorizing Provider  acetaminophen (TYLENOL) 325 MG tablet Take 650 mg by mouth every 6 (six) hours as needed.   Yes Historical Provider, MD  Prenat-FeFum-FePo-FA-Omega 3 (CONCEPT DHA) 53.5-38-1 MG CAPS Take 1 capsule by mouth daily. 03/08/14  Yes Cheron EveryKimberly Randall Booker, CNM   BP 94/69 mmHg  Pulse 86  Temp(Src) 98.1 F (36.7 C) (Oral)  Resp 16  Ht 5\' 1"  (1.549 m)  Wt 150 lb (68.04 kg)  BMI 28.36 kg/m2  SpO2 99%  LMP 10/07/2013 Physical Exam  Constitutional: She is oriented to person, place, and time. She appears well-developed and well-nourished. No distress.  HENT:  Head: Normocephalic and atraumatic.  Mouth/Throat: Oropharynx is clear and moist. No oropharyngeal exudate.  Eyes: Conjunctivae and EOM are normal. Pupils are  equal, round, and reactive to light.  Neck: Normal range of motion. Neck supple.  No meningismus.  Cardiovascular: Normal rate, regular rhythm, normal heart sounds and intact distal pulses.   No murmur heard. Pulmonary/Chest: Effort normal and breath sounds normal. No respiratory distress.  Abdominal: Soft. There is tenderness. There is no rebound and no guarding.  Moderate epigastric tenderness, gravid abdomen, no  right upper quadrant tenderness  Musculoskeletal: Normal range of motion. She exhibits tenderness. She exhibits no edema.  Paraspinal thoracic tenderness  Neurological: She is alert and oriented to person, place, and time. No cranial nerve deficit. She exhibits normal muscle tone. Coordination normal.  No ataxia on finger to nose bilaterally. No pronator drift. 5/5 strength throughout. CN 2-12 intact. Negative Romberg. Equal grip strength. Sensation intact. Gait is normal.   Skin: Skin is warm.  Psychiatric: She has a normal mood and affect. Her behavior is normal.  Nursing note and vitals reviewed.   ED Course  Procedures (including critical care time) Labs Review Labs Reviewed  CBC WITH DIFFERENTIAL - Abnormal; Notable for the following:    WBC 12.7 (*)    RBC 3.81 (*)    Hemoglobin 10.7 (*)    HCT 32.5 (*)    Neutrophils Relative % 78 (*)    Neutro Abs 9.9 (*)    All other components within normal limits  COMPREHENSIVE METABOLIC PANEL - Abnormal; Notable for the following:    Potassium 3.4 (*)    Total Bilirubin 0.2 (*)    All other components within normal limits  URINALYSIS, ROUTINE W REFLEX MICROSCOPIC - Abnormal; Notable for the following:    Specific Gravity, Urine >1.030 (*)    All other components within normal limits  LIPASE, BLOOD    Imaging Review No results found.   EKG Interpretation   Date/Time:  Tuesday May 02 2014 05:04:31 EST Ventricular Rate:  74 PR Interval:  159 QRS Duration: 90 QT Interval:  373 QTC Calculation: 414 R Axis:   70 Text Interpretation:  Sinus rhythm RSR' in V1 or V2, right VCD or RVH No  previous ECGs available Confirmed by Verdella Laidlaw  MD, Forestine Macho [phone]) on  05/02/2014 5:08:51 AM      MDM   Final diagnoses:  Epigastric abdominal pain   Epigastric pain with nausea. [redacted] weeks gestation denies abdominal cramping, bleeding or leaking of fluid. FHT 140s  Epigastric tenderness on exam. EKG normal sinus rhythm. No hypoxia or  tachycardia. Labs show normal LFTs and lipase.  Fetal monitor tracing reassuring per [place]. Fetal heart tones 140s.  Ordered Mylanta and Protonix. Patient refusing stating she does not want to take any medications.  Pain has resolved on its own. She is tolerating PO.  Discussed with patient likely etiology of acid reflux disease. Discussed over-the-counter antacids that are safe in pregnancy. Discussed elevating head of bed and dietary modifications. Follow up with OB this week. Return precautions discussed.    Glynn Octave, MD 05/02/14 [phone]

## 2014-05-02 NOTE — Progress Notes (Signed)
Call of pt presenting with N&V and back pain similar to recent episode. Place on EFM/Toco by [facility] RN. Tracing reassuring

## 2014-05-02 NOTE — ED Notes (Signed)
Patient reports epigastric pain and vomiting. States pain to upper back as well. States has had episode of similar symptoms before. Denies diarrhea or any vaginal bleeding or discharge.

## 2014-05-02 NOTE — ED Notes (Signed)
Spoke with  Victorino DikeJennifer, [facility] who stated that fetal heart rate looked good and she saw no contractions on monitor. Dr. Manus Gunningancour notified.

## 2014-05-02 NOTE — Discharge Instructions (Signed)
Gastroesophageal Reflux Disease, Adult Use maalox or mylanta as needed for symptoms. Follow up with Dr. Despina HiddenEure this week. Return to the ED if you develop new or worsening symptoms. Gastroesophageal reflux disease (GERD) happens when acid from your stomach flows up into the esophagus. When acid comes in contact with the esophagus, the acid causes soreness (inflammation) in the esophagus. Over time, GERD may create small holes (ulcers) in the lining of the esophagus. CAUSES   Increased body weight. This puts pressure on the stomach, making acid rise from the stomach into the esophagus.  Smoking. This increases acid production in the stomach.  Drinking alcohol. This causes decreased pressure in the lower esophageal sphincter (valve or ring of muscle between the esophagus and stomach), allowing acid from the stomach into the esophagus.  Late evening meals and a full stomach. This increases pressure and acid production in the stomach.  A malformed lower esophageal sphincter. Sometimes, no cause is found. SYMPTOMS   Burning pain in the lower part of the mid-chest behind the breastbone and in the mid-stomach area. This may occur twice a week or more often.  Trouble swallowing.  Sore throat.  Dry cough.  Asthma-like symptoms including chest tightness, shortness of breath, or wheezing. DIAGNOSIS  Your caregiver may be able to diagnose GERD based on your symptoms. In some cases, X-rays and other tests may be done to check for complications or to check the condition of your stomach and esophagus. TREATMENT  Your caregiver may recommend over-the-counter or prescription medicines to help decrease acid production. Ask your caregiver before starting or adding any new medicines.  HOME CARE INSTRUCTIONS   Change the factors that you can control. Ask your caregiver for guidance concerning weight loss, quitting smoking, and alcohol consumption.  Avoid foods and drinks that make your symptoms worse, such  as:  Caffeine or alcoholic drinks.  Chocolate.  Peppermint or mint flavorings.  Garlic and onions.  Spicy foods.  Citrus fruits, such as oranges, lemons, or limes.  Tomato-based foods such as sauce, chili, salsa, and pizza.  Fried and fatty foods.  Avoid lying down for the 3 hours prior to your bedtime or prior to taking a nap.  Eat small, frequent meals instead of large meals.  Wear loose-fitting clothing. Do not wear anything tight around your waist that causes pressure on your stomach.  Raise the head of your bed 6 to 8 inches with wood blocks to help you sleep. Extra pillows will not help.  Only take over-the-counter or prescription medicines for pain, discomfort, or fever as directed by your caregiver.  Do not take aspirin, ibuprofen, or other nonsteroidal anti-inflammatory drugs (NSAIDs). SEEK IMMEDIATE MEDICAL CARE IF:   You have pain in your arms, neck, jaw, teeth, or back.  Your pain increases or changes in intensity or duration.  You develop nausea, vomiting, or sweating (diaphoresis).  You develop shortness of breath, or you faint.  Your vomit is green, yellow, black, or looks like coffee grounds or blood.  Your stool is red, bloody, or black. These symptoms could be signs of other problems, such as heart disease, gastric bleeding, or esophageal bleeding. MAKE SURE YOU:   Understand these instructions.  Will watch your condition.  Will get help right away if you are not doing well or get worse. Document Released: 01/08/2005 Document Revised: 06/23/2011 Document Reviewed: 10/18/2010 [facility] Patient Information 2015 [place], [place]. This information is not intended to replace advice given to you by your health care provider. Make sure you  discuss any questions you have with your health care provider. ° °

## 2014-05-17 ENCOUNTER — Ambulatory Visit (INDEPENDENT_AMBULATORY_CARE_PROVIDER_SITE_OTHER): Payer: Medicaid Other | Admitting: Advanced Practice Midwife

## 2014-05-17 VITALS — BP 110/60 | Wt 155.0 lb

## 2014-05-17 DIAGNOSIS — Z331 Pregnant state, incidental: Secondary | ICD-10-CM

## 2014-05-17 DIAGNOSIS — Z3403 Encounter for supervision of normal first pregnancy, third trimester: Secondary | ICD-10-CM

## 2014-05-17 DIAGNOSIS — Z1389 Encounter for screening for other disorder: Secondary | ICD-10-CM

## 2014-05-17 LAB — POCT URINALYSIS DIPSTICK
GLUCOSE UA: NEGATIVE
Ketones, UA: NEGATIVE
LEUKOCYTES UA: NEGATIVE
Nitrite, UA: NEGATIVE
PROTEIN UA: NEGATIVE

## 2014-05-17 NOTE — Patient Instructions (Addendum)
Decongestant Zyrtec Saline spray Netti pot  [phone] ask for Medical Records

## 2014-05-17 NOTE — Progress Notes (Signed)
G2P0010 4649w6d Estimated Date of Delivery: 08/03/14  Blood pressure 110/60, weight 70.308 kg (155 lb), last menstrual period 10/07/2013.   BP weight and urine results all reviewed and noted.  Please refer to the obstetrical flow sheet for the fundal height and fetal heart rate documentation:  Patient reports good fetal movement, denies any bleeding and no rupture of membranes symptoms or regular contractions. Patient c/o nasal congestion for a few days All questions were answered.  Plan:  Continued routine obstetrical care, OTC meds discussed.  [place]  Follow up in  weeks for OB appointment,

## 2014-05-24 ENCOUNTER — Encounter: Payer: Medicaid Other | Admitting: Advanced Practice Midwife

## 2014-06-07 ENCOUNTER — Ambulatory Visit (INDEPENDENT_AMBULATORY_CARE_PROVIDER_SITE_OTHER): Payer: Medicaid Other | Admitting: Advanced Practice Midwife

## 2014-06-07 ENCOUNTER — Encounter: Payer: Self-pay | Admitting: Advanced Practice Midwife

## 2014-06-07 VITALS — BP 110/68 | HR 80 | Wt 160.0 lb

## 2014-06-07 DIAGNOSIS — Z1389 Encounter for screening for other disorder: Secondary | ICD-10-CM

## 2014-06-07 DIAGNOSIS — Z331 Pregnant state, incidental: Secondary | ICD-10-CM

## 2014-06-07 DIAGNOSIS — Z3403 Encounter for supervision of normal first pregnancy, third trimester: Secondary | ICD-10-CM

## 2014-06-07 LAB — POCT URINALYSIS DIPSTICK
Blood, UA: NEGATIVE
Glucose, UA: NEGATIVE
KETONES UA: NEGATIVE
Leukocytes, UA: NEGATIVE
Nitrite, UA: NEGATIVE
PROTEIN UA: NEGATIVE

## 2014-06-07 NOTE — Progress Notes (Signed)
G2P0010 639w6d Estimated Date of Delivery: 08/03/14  Blood pressure 110/68, pulse 80, weight 160 lb (72.576 kg), last menstrual period 10/07/2013.   BP weight and urine results all reviewed and noted.  Please refer to the obstetrical flow sheet for the fundal height and fetal heart rate documentation:  Patient reports good fetal movement, denies any bleeding and no rupture of membranes symptoms or regular contractions. Patient is without complaints other than normal pregnancy complaints  All questions were answered.  Plan:  Continued routine obstetrical care,   Follow up in 2 weeks for OB appointment,

## 2014-06-21 ENCOUNTER — Ambulatory Visit (INDEPENDENT_AMBULATORY_CARE_PROVIDER_SITE_OTHER): Payer: Medicaid Other | Admitting: Advanced Practice Midwife

## 2014-06-21 VITALS — BP 112/70 | HR 96 | Wt 163.0 lb

## 2014-06-21 DIAGNOSIS — Z3403 Encounter for supervision of normal first pregnancy, third trimester: Secondary | ICD-10-CM

## 2014-06-21 DIAGNOSIS — Z1389 Encounter for screening for other disorder: Secondary | ICD-10-CM

## 2014-06-21 DIAGNOSIS — Z331 Pregnant state, incidental: Secondary | ICD-10-CM

## 2014-06-21 LAB — POCT URINALYSIS DIPSTICK
GLUCOSE UA: NEGATIVE
KETONES UA: NEGATIVE
Leukocytes, UA: NEGATIVE
Nitrite, UA: NEGATIVE
PROTEIN UA: NEGATIVE
RBC UA: NEGATIVE

## 2014-06-21 NOTE — Progress Notes (Signed)
G2P0010 4656w6d Estimated Date of Delivery: 08/03/14  Blood pressure 112/70, pulse 96, weight 163 lb (73.936 kg), last menstrual period 10/07/2013.   BP weight and urine results all reviewed and noted.  Please refer to the obstetrical flow sheet for the fundal height and fetal heart rate documentation:  Patient reports good fetal movement, denies any bleeding and no rupture of membranes symptoms or regular contractions. Patient is without complaints. All questions were answered.  Plan:  Continued routine obstetrical care,   Follow up in 2 weeks for OB appointment,

## 2014-07-03 ENCOUNTER — Ambulatory Visit (INDEPENDENT_AMBULATORY_CARE_PROVIDER_SITE_OTHER): Payer: Medicaid Other | Admitting: Women's Health

## 2014-07-03 VITALS — BP 120/74 | HR 80 | Wt 164.0 lb

## 2014-07-03 DIAGNOSIS — Z1389 Encounter for screening for other disorder: Secondary | ICD-10-CM

## 2014-07-03 DIAGNOSIS — Z331 Pregnant state, incidental: Secondary | ICD-10-CM

## 2014-07-03 DIAGNOSIS — Z3493 Encounter for supervision of normal pregnancy, unspecified, third trimester: Secondary | ICD-10-CM

## 2014-07-03 NOTE — Progress Notes (Signed)
Low-risk OB appointment G2P0010 4970w4d Estimated Date of Delivery: 08/03/14 BP 120/74 mmHg  Pulse 80  Wt 164 lb (74.39 kg)  LMP 10/07/2013  BP, weight, and urine reviewed.  Refer to obstetrical flow sheet for FH & FHR.  Reports good fm.  Denies regular uc's, lof, vb, or uti s/s. Had some epigastric/upper back pain yesterday w/ n/v x 1- has resolved now.  Reviewed ptl s/s, fkc. Plan:  Continue routine obstetrical care  F/U in 1wk for OB appointment and gbs

## 2014-07-03 NOTE — Patient Instructions (Addendum)
Call the office ([phone]) or go to [facility] if:  You begin to have strong, frequent contractions  Your water breaks.  Sometimes it is a big gush of fluid, sometimes it is just a trickle that keeps getting your panties wet or running down your legs  You have vaginal bleeding.  It is normal to have a small amount of spotting if your cervix was checked.   You don't feel your baby moving like normal.  If you don't, get you something to eat and drink and lay down and focus on feeling your baby move.  You should feel at least 10 movements in 2 hours.  If you don't, you should call the office or go to [facility].    [place] Pediatricians:  [facility] [phone]            [facility] [phone]                 [facility] [phone] (usually doesn't accept new patients unless you have family there already, you are always welcome to call and ask)             [facility] ([address] [place]) [phone]   [place] Pediatricians:   [facility]: [phone]  [facility]: [phone]   Preterm Labor Information Preterm labor is when labor starts at less than 37 weeks of pregnancy. The normal length of a pregnancy is 39 to 41 weeks. CAUSES Often, there is no identifiable underlying cause as to why a woman goes into preterm labor. One of the most common known causes of preterm labor is infection. Infections of the uterus, cervix, vagina, amniotic sac, bladder, kidney, or even the lungs (pneumonia) can cause labor to start. Other suspected causes of preterm labor include:  6. Urogenital infections, such as yeast infections and bacterial vaginosis.  7. Uterine abnormalities (uterine shape, uterine septum, fibroids, or bleeding from the placenta).  8. A cervix that has been operated on (it may fail to stay closed).  9. Malformations in the fetus.  10. Multiple  gestations (twins, triplets, and so on).  11. Breakage of the amniotic sac.  RISK FACTORS 2. Having a previous history of preterm labor.  3. Having premature rupture of membranes (PROM).  4. Having a placenta that covers the opening of the cervix (placenta previa).  5. Having a placenta that separates from the uterus (placental abruption).  6. Having a cervix that is too weak to hold the fetus in the uterus (incompetent cervix).  7. Having too much fluid in the amniotic sac (polyhydramnios).  8. Taking illegal drugs or smoking while pregnant.  9. Not gaining enough weight while pregnant.  10. Being younger than 18 and older than 26 years old.  11. Having a low socioeconomic status.  12. Being African American. SYMPTOMS Signs and symptoms of preterm labor include:  2. Menstrual-like cramps, abdominal pain, or back pain. 3. Uterine contractions that are regular, as frequent as six in an hour, regardless of their intensity (may be mild or painful). 4. Contractions that start on the top of the uterus and spread down to the lower abdomen and back.  5. A sense of increased pelvic pressure.  6. A watery or bloody mucus discharge that comes from the vagina.  TREATMENT Depending on the length of the pregnancy and other circumstances, your health care provider may suggest bed rest. If necessary, there are medicines that can be given to stop   contractions and to mature the fetal lungs. If labor happens before 34 weeks of pregnancy, a prolonged hospital stay may be recommended. Treatment depends on the condition of both you and the fetus.  WHAT SHOULD YOU DO IF YOU THINK YOU ARE IN PRETERM LABOR? Call your health care provider right away. You will need to go to the hospital to get checked immediately. HOW CAN YOU PREVENT PRETERM LABOR IN FUTURE PREGNANCIES? You should:  2. Stop smoking if you smoke. 3. Maintain healthy weight gain and avoid chemicals and drugs that are not  necessary. 4. Be watchful for any type of infection. 5. Inform your health care provider if you have a known history of preterm labor. Document Released: 06/21/2003 Document Revised: 12/01/2012 Document Reviewed: 05/03/2012 ExitCare Patient Information 2015 [employer]. This information is not intended to replace advice given to you by your health care provider. Make sure you discuss any questions you have with your health care provider.  

## 2014-07-05 ENCOUNTER — Encounter: Payer: Medicaid Other | Admitting: Women's Health

## 2014-07-12 ENCOUNTER — Ambulatory Visit (INDEPENDENT_AMBULATORY_CARE_PROVIDER_SITE_OTHER): Payer: Medicaid Other | Admitting: Obstetrics and Gynecology

## 2014-07-12 ENCOUNTER — Encounter: Payer: Self-pay | Admitting: Obstetrics and Gynecology

## 2014-07-12 DIAGNOSIS — Z118 Encounter for screening for other infectious and parasitic diseases: Secondary | ICD-10-CM

## 2014-07-12 DIAGNOSIS — Z1389 Encounter for screening for other disorder: Secondary | ICD-10-CM

## 2014-07-12 DIAGNOSIS — Z3493 Encounter for supervision of normal pregnancy, unspecified, third trimester: Secondary | ICD-10-CM

## 2014-07-12 DIAGNOSIS — Z3685 Encounter for antenatal screening for Streptococcus B: Secondary | ICD-10-CM

## 2014-07-12 DIAGNOSIS — Z331 Pregnant state, incidental: Secondary | ICD-10-CM

## 2014-07-12 DIAGNOSIS — Z1159 Encounter for screening for other viral diseases: Secondary | ICD-10-CM

## 2014-07-12 LAB — OB RESULTS CONSOLE GBS: GBS: NEGATIVE

## 2014-07-12 LAB — OB RESULTS CONSOLE GC/CHLAMYDIA
Chlamydia: NEGATIVE
Gonorrhea: NEGATIVE

## 2014-07-12 LAB — POCT URINALYSIS DIPSTICK
GLUCOSE UA: NEGATIVE
Ketones, UA: NEGATIVE
LEUKOCYTES UA: NEGATIVE
Nitrite, UA: NEGATIVE
RBC UA: NEGATIVE

## 2014-07-12 NOTE — Progress Notes (Signed)
Patient ID: Anna Hartman, female   DOB: 08/11/1990, 24 y.o.   MRN: [id number]  This chart was SCRIBED for Christin BachJohn Delinda Malan, MD by Ronney LionSuzanne Le, ED Scribe. This patient was seen in room 1 and the patient's care was started at 10:43 AM.   G2P0010 2561w6d Estimated Date of Delivery: 08/03/14  Blood pressure 110/70, pulse 84, weight 172 lb (78.019 kg), last menstrual period 10/07/2013.   refer to the ob flow sheet for FH and FHR, also BP, Wt, Urine results:notable for a little protein  Patient reports  + good fetal movement, denies any bleeding and no rupture of membranes symptoms or regular contractions. Patient complaints: Patient has gained 8 pounds within 9 days.    Wt Readings from Last 3 Encounters:  07/12/14 172 lb (78.019 kg)  07/03/14 164 lb (74.39 kg)  06/21/14 163 lb (73.936 kg)   FHR: 132 bpm FH: 38 cm Cervix: 1 cm, posterior -3  Questions were answered. Assessment: Pregnancy 261w6d   Cervix 1 cm Plan:  Continued routine obstetrical care  F/u in 1 weeks for routine care  I personally performed the services described in this documentation, which was SCRIBED in my presence. The recorded information has been reviewed and considered accurate. It has been edited as necessary during review. Tilda BurrowFERGUSON,Tal Neer V, MD

## 2014-07-12 NOTE — Progress Notes (Signed)
Pt states that she has headaches off and on and a pinching pain down there.

## 2014-07-13 LAB — GC/CHLAMYDIA PROBE AMP
Chlamydia trachomatis, NAA: NEGATIVE
Neisseria gonorrhoeae by PCR: NEGATIVE

## 2014-07-14 LAB — STREP GP B NAA: Strep Gp B NAA: NEGATIVE

## 2014-07-17 ENCOUNTER — Encounter: Payer: Self-pay | Admitting: Obstetrics and Gynecology

## 2014-07-17 ENCOUNTER — Ambulatory Visit (INDEPENDENT_AMBULATORY_CARE_PROVIDER_SITE_OTHER): Payer: Medicaid Other | Admitting: Obstetrics and Gynecology

## 2014-07-17 DIAGNOSIS — Z331 Pregnant state, incidental: Secondary | ICD-10-CM

## 2014-07-17 DIAGNOSIS — Z1389 Encounter for screening for other disorder: Secondary | ICD-10-CM

## 2014-07-17 DIAGNOSIS — Z3493 Encounter for supervision of normal pregnancy, unspecified, third trimester: Secondary | ICD-10-CM

## 2014-07-17 LAB — POCT URINALYSIS DIPSTICK
Blood, UA: NEGATIVE
GLUCOSE UA: NEGATIVE
Ketones, UA: NEGATIVE
Leukocytes, UA: NEGATIVE
NITRITE UA: NEGATIVE

## 2014-07-17 NOTE — Progress Notes (Signed)
Pt states that she has noticed a "leaking of fluid" not sure that it is urine.

## 2014-07-17 NOTE — Progress Notes (Signed)
Patient ID: Anna Hartman, female   DOB: 09/09/1990, 24 y.o.   MRN: [id number]  G2P0010 4160w4d Estimated Date of Delivery: 08/03/14  Blood pressure 126/90, pulse 82, weight 173 lb (78.472 kg), last menstrual period 10/07/2013.   refer to the ob flow sheet for FH and FHR, also BP, Wt, Urine results:notable for negative  Patient reports  + good fetal movement, denies any bleeding and no rupture of membranes symptoms or regular contractions. Patient complaints: Pt reports intermittent vaginal discharge. She thinks it may be urine. Pt notes irregular contractions.  FH-36 cm FHR-138 bpm Baby is in vertex position Edema 2+ pretibial , normal reflexes  Questions were answered. Assessment:  1. 7460w4d, G2P0010 2. Gp B Strep negative 3. Normal cervical secretions; membranes non-ruptured 4. 2+ edema pre-tibial 5  Pt left with out bp recheck  Plan:  Continued routine obstetrical care  F/u in 1 week for routine care   This chart was scribed for Tilda BurrowJohn Shiree Altemus V, MD by Gwenyth Oberatherine Macek, ED Scribe. This patient was seen in room 2 and the patient's care was started at 11:55 AM.   I personally performed the services described in this documentation, which was SCRIBED in my presence. The recorded information has been reviewed and considered accurate. It has been edited as necessary during review. Tilda BurrowFERGUSON,Bellarose Burtt V, MD

## 2014-07-18 ENCOUNTER — Inpatient Hospital Stay (HOSPITAL_COMMUNITY)
Admission: AD | Admit: 2014-07-18 | Discharge: 2014-07-19 | Disposition: A | Discharge status: Home / Self Care | Payer: Medicaid Other | Source: Ambulatory Visit | Attending: Family Medicine | Admitting: Family Medicine

## 2014-07-18 DIAGNOSIS — Z3403 Encounter for supervision of normal first pregnancy, third trimester: Secondary | ICD-10-CM

## 2014-07-18 DIAGNOSIS — Z3A38 38 weeks gestation of pregnancy: Secondary | ICD-10-CM | POA: Insufficient documentation

## 2014-07-19 ENCOUNTER — Encounter (HOSPITAL_COMMUNITY): Payer: Self-pay

## 2014-07-19 DIAGNOSIS — Z3A38 38 weeks gestation of pregnancy: Secondary | ICD-10-CM | POA: Diagnosis not present

## 2014-07-19 NOTE — Discharge Instructions (Signed)
Braxton Hicks Contractions °Contractions of the uterus can occur throughout pregnancy. Contractions are not always a sign that you are in labor.  °WHAT ARE BRAXTON HICKS CONTRACTIONS?  °Contractions that occur before labor are called Braxton Hicks contractions, or false labor. Toward the end of pregnancy (32-34 weeks), these contractions can develop more often and may become more forceful. This is not true labor because these contractions do not result in opening (dilatation) and thinning of the cervix. They are sometimes difficult to tell apart from true labor because these contractions can be forceful and people have different pain tolerances. You should not feel embarrassed if you go to the hospital with false labor. Sometimes, the only way to tell if you are in true labor is for your health care provider to look for changes in the cervix. °If there are no prenatal problems or other health problems associated with the pregnancy, it is completely safe to be sent home with false labor and await the onset of true labor. °HOW CAN YOU TELL THE DIFFERENCE BETWEEN TRUE AND FALSE LABOR? °False Labor °· The contractions of false labor are usually shorter and not as hard as those of true labor.   °· The contractions are usually irregular.   °· The contractions are often felt in the front of the lower abdomen and in the groin.   °· The contractions may go away when you walk around or change positions while lying down.   °· The contractions get weaker and are shorter lasting as time goes on.   °· The contractions do not usually become progressively stronger, regular, and closer together as with true labor.   °True Labor °1. Contractions in true labor last 30-70 seconds, become very regular, usually become more intense, and increase in frequency.   °2. The contractions do not go away with walking.   °3. The discomfort is usually felt in the top of the uterus and spreads to the lower abdomen and low back.   °4. True labor can  be determined by your health care provider with an exam. This will show that the cervix is dilating and getting thinner.   °WHAT TO REMEMBER °· Keep up with your usual exercises and follow other instructions given by your health care provider.   °· Take medicines as directed by your health care provider.   °· Keep your regular prenatal appointments.   °· Eat and drink lightly if you think you are going into labor.   °· If Braxton Hicks contractions are making you uncomfortable:   °· Change your position from lying down or resting to walking, or from walking to resting.   °· Sit and rest in a tub of warm water.   °· Drink 2-3 glasses of water. Dehydration may cause these contractions.   °· Do slow and deep breathing several times an hour.   °WHEN SHOULD I SEEK IMMEDIATE MEDICAL CARE? °Seek immediate medical care if: °· Your contractions become stronger, more regular, and closer together.   °· You have fluid leaking or gushing from your vagina.   °· You have a fever.   °· You pass blood-tinged mucus.   °· You have vaginal bleeding.   °· You have continuous abdominal pain.   °· You have low back pain that you never had before.   °· You feel your baby's head pushing down and causing pelvic pressure.   °· Your baby is not moving as much as it used to.   °Document Released: 03/31/2005 Document Revised: 04/05/2013 Document Reviewed: 01/10/2013 °ExitCare® Patient Information ©2015 [employer]. This information is not intended to replace advice given to you by your health care   provider. Make sure you discuss any questions you have with your health care provider. ° °Fetal Movement Counts °Patient Name: __________________________________________________ Patient Due Date: ____________________ °Performing a fetal movement count is highly recommended in high-risk pregnancies, but it is good for every pregnant woman to do. Your health care provider may ask you to start counting fetal movements at 28 weeks of the pregnancy. Fetal  movements often increase: °· After eating a full meal. °· After physical activity. °· After eating or drinking something sweet or cold. °· At rest. °Pay attention to when you feel the baby is most active. This will help you notice a pattern of your baby's sleep and wake cycles and what factors contribute to an increase in fetal movement. It is important to perform a fetal movement count at the same time each day when your baby is normally most active.  °HOW TO COUNT FETAL MOVEMENTS °5. Find a quiet and comfortable area to sit or lie down on your left side. Lying on your left side provides the best blood and oxygen circulation to your baby. °6. Write down the day and time on a sheet of paper or in a journal. °7. Start counting kicks, flutters, swishes, rolls, or jabs in a 2-hour period. You should feel at least 10 movements within 2 hours. °8. If you do not feel 10 movements in 2 hours, wait 2-3 hours and count again. Look for a change in the pattern or not enough counts in 2 hours. °SEEK MEDICAL CARE IF: °· You feel less than 10 counts in 2 hours, tried twice. °· There is no movement in over an hour. °· The pattern is changing or taking longer each day to reach 10 counts in 2 hours. °· You feel the baby is not moving as he or she usually does. °Date: ____________ Movements: ____________ Start time: ____________ Finish time: ____________  °Date: ____________ Movements: ____________ Start time: ____________ Finish time: ____________ °Date: ____________ Movements: ____________ Start time: ____________ Finish time: ____________ °Date: ____________ Movements: ____________ Start time: ____________ Finish time: ____________ °Date: ____________ Movements: ____________ Start time: ____________ Finish time: ____________ °Date: ____________ Movements: ____________ Start time: ____________ Finish time: ____________ °Date: ____________ Movements: ____________ Start time: ____________ Finish time: ____________ °Date: ____________  Movements: ____________ Start time: ____________ Finish time: ____________  °Date: ____________ Movements: ____________ Start time: ____________ Finish time: ____________ °Date: ____________ Movements: ____________ Start time: ____________ Finish time: ____________ °Date: ____________ Movements: ____________ Start time: ____________ Finish time: ____________ °Date: ____________ Movements: ____________ Start time: ____________ Finish time: ____________ °Date: ____________ Movements: ____________ Start time: ____________ Finish time: ____________ °Date: ____________ Movements: ____________ Start time: ____________ Finish time: ____________ °Date: ____________ Movements: ____________ Start time: ____________ Finish time: ____________  °Date: ____________ Movements: ____________ Start time: ____________ Finish time: ____________ °Date: ____________ Movements: ____________ Start time: ____________ Finish time: ____________ °Date: ____________ Movements: ____________ Start time: ____________ Finish time: ____________ °Date: ____________ Movements: ____________ Start time: ____________ Finish time: ____________ °Date: ____________ Movements: ____________ Start time: ____________ Finish time: ____________ °Date: ____________ Movements: ____________ Start time: ____________ Finish time: ____________ °Date: ____________ Movements: ____________ Start time: ____________ Finish time: ____________  °Date: ____________ Movements: ____________ Start time: ____________ Finish time: ____________ °Date: ____________ Movements: ____________ Start time: ____________ Finish time: ____________ °Date: ____________ Movements: ____________ Start time: ____________ Finish time: ____________ °Date: ____________ Movements: ____________ Start time: ____________ Finish time: ____________ °Date: ____________ Movements: ____________ Start time: ____________ Finish time: ____________ °Date: ____________ Movements: ____________ Start time:  ____________ Finish time: ____________ °Date: ____________ Movements:   ____________ Start time: ____________ Finish time: ____________  °Date: ____________ Movements: ____________ Start time: ____________ Finish time: ____________ °Date: ____________ Movements: ____________ Start time: ____________ Finish time: ____________ °Date: ____________ Movements: ____________ Start time: ____________ Finish time: ____________ °Date: ____________ Movements: ____________ Start time: ____________ Finish time: ____________ °Date: ____________ Movements: ____________ Start time: ____________ Finish time: ____________ °Date: ____________ Movements: ____________ Start time: ____________ Finish time: ____________ °Date: ____________ Movements: ____________ Start time: ____________ Finish time: ____________  °Date: ____________ Movements: ____________ Start time: ____________ Finish time: ____________ °Date: ____________ Movements: ____________ Start time: ____________ Finish time: ____________ °Date: ____________ Movements: ____________ Start time: ____________ Finish time: ____________ °Date: ____________ Movements: ____________ Start time: ____________ Finish time: ____________ °Date: ____________ Movements: ____________ Start time: ____________ Finish time: ____________ °Date: ____________ Movements: ____________ Start time: ____________ Finish time: ____________ °Date: ____________ Movements: ____________ Start time: ____________ Finish time: ____________  °Date: ____________ Movements: ____________ Start time: ____________ Finish time: ____________ °Date: ____________ Movements: ____________ Start time: ____________ Finish time: ____________ °Date: ____________ Movements: ____________ Start time: ____________ Finish time: ____________ °Date: ____________ Movements: ____________ Start time: ____________ Finish time: ____________ °Date: ____________ Movements: ____________ Start time: ____________ Finish time: ____________ °Date:  ____________ Movements: ____________ Start time: ____________ Finish time: ____________ °Date: ____________ Movements: ____________ Start time: ____________ Finish time: ____________  °Date: ____________ Movements: ____________ Start time: ____________ Finish time: ____________ °Date: ____________ Movements: ____________ Start time: ____________ Finish time: ____________ °Date: ____________ Movements: ____________ Start time: ____________ Finish time: ____________ °Date: ____________ Movements: ____________ Start time: ____________ Finish time: ____________ °Date: ____________ Movements: ____________ Start time: ____________ Finish time: ____________ °Date: ____________ Movements: ____________ Start time: ____________ Finish time: ____________ °Document Released: 04/30/2006 Document Revised: 08/15/2013 Document Reviewed: 01/26/2012 °ExitCare® Patient Information ©2015 [employer]. This information is not intended to replace advice given to you by your health care provider. Make sure you discuss any questions you have with your health care provider. ° °

## 2014-07-19 NOTE — MAU Note (Signed)
PT  SAYS SHE HAS BEEN HURTING   BAD  SINCE 930PM-  SHE CALLED ON-CALL  RN-  TOLD  TO COME  TO MAU.  VE LAST WEEK  WAS 1  CM.    DENIES HSV AND MRSA. GBS- NEG

## 2014-07-20 ENCOUNTER — Encounter: Payer: Self-pay | Admitting: Advanced Practice Midwife

## 2014-07-20 ENCOUNTER — Telehealth: Payer: Self-pay | Admitting: *Deleted

## 2014-07-20 ENCOUNTER — Ambulatory Visit (INDEPENDENT_AMBULATORY_CARE_PROVIDER_SITE_OTHER): Payer: Medicaid Other | Admitting: Advanced Practice Midwife

## 2014-07-20 VITALS — BP 112/76 | HR 80

## 2014-07-20 DIAGNOSIS — Z3403 Encounter for supervision of normal first pregnancy, third trimester: Secondary | ICD-10-CM

## 2014-07-20 DIAGNOSIS — O368133 Decreased fetal movements, third trimester, fetus 3: Secondary | ICD-10-CM

## 2014-07-20 DIAGNOSIS — Z1389 Encounter for screening for other disorder: Secondary | ICD-10-CM

## 2014-07-20 DIAGNOSIS — O368131 Decreased fetal movements, third trimester, fetus 1: Secondary | ICD-10-CM

## 2014-07-20 DIAGNOSIS — Z331 Pregnant state, incidental: Secondary | ICD-10-CM

## 2014-07-20 DIAGNOSIS — Z3493 Encounter for supervision of normal pregnancy, unspecified, third trimester: Secondary | ICD-10-CM

## 2014-07-20 LAB — POCT URINALYSIS DIPSTICK
Blood, UA: NEGATIVE
Glucose, UA: NEGATIVE
Ketones, UA: NEGATIVE
Leukocytes, UA: NEGATIVE
NITRITE UA: NEGATIVE

## 2014-07-20 NOTE — Progress Notes (Signed)
Pt states that she has had a change in her discharge it is now thick, yellow and a lot of it.

## 2014-07-20 NOTE — Patient Instructions (Signed)

## 2014-07-20 NOTE — Telephone Encounter (Signed)
Pt called with complaints of frequent contractions and decreased fetal movement. No spotting or bleeding. Has had a mucus discharge. ? Leaking fluid. I advised she needs to be seen. Pt to come in at 1:45 to be worked in. [employer]SY

## 2014-07-20 NOTE — Progress Notes (Signed)
  WORK IN FOR DECREASED FETAL MOVEMENT TODAY  G2P0010 3342w0d Estimated Date of Delivery: 08/03/14  Blood pressure 112/76, pulse 80, last menstrual period 10/07/2013.   BP weight and urine results all reviewed and noted.  Please refer to the obstetrical flow sheet for the fundal height and fetal heart rate documentation: reactive NST, uterine irritability  Patient reports good fetal movement, denies any bleeding and no rupture of membranes symptoms or regular contractions. Patient has been to MAU and MD office 3 times this week with false labor.  Labor precautions discussed.   All questions were answered.  Plan:  Continued routine obstetrical care, hydrate well  Follow up in as scheduled for OB appointment,

## 2014-07-24 ENCOUNTER — Ambulatory Visit (INDEPENDENT_AMBULATORY_CARE_PROVIDER_SITE_OTHER): Payer: Medicaid Other | Admitting: Obstetrics & Gynecology

## 2014-07-24 ENCOUNTER — Encounter: Payer: Self-pay | Admitting: Obstetrics & Gynecology

## 2014-07-24 VITALS — BP 110/80 | HR 84 | Wt 174.0 lb

## 2014-07-24 DIAGNOSIS — Z3403 Encounter for supervision of normal first pregnancy, third trimester: Secondary | ICD-10-CM

## 2014-07-24 DIAGNOSIS — Z331 Pregnant state, incidental: Secondary | ICD-10-CM

## 2014-07-24 DIAGNOSIS — Z1389 Encounter for screening for other disorder: Secondary | ICD-10-CM

## 2014-07-24 LAB — POCT URINALYSIS DIPSTICK
GLUCOSE UA: NEGATIVE
KETONES UA: NEGATIVE
LEUKOCYTES UA: NEGATIVE
Nitrite, UA: NEGATIVE
RBC UA: NEGATIVE

## 2014-07-24 NOTE — Progress Notes (Signed)
G2P0010 1565w4d Estimated Date of Delivery: 08/03/14  Blood pressure 110/80, pulse 84, weight 174 lb (78.926 kg), last menstrual period 10/07/2013.   BP weight and urine results all reviewed and noted.  Please refer to the obstetrical flow sheet for the fundal height and fetal heart rate documentation:  Patient reports good fetal movement, denies any bleeding and no rupture of membranes symptoms or regular contractions. Patient is without complaints. All questions were answered.  Plan:  Continued routine obstetrical care,   Follow up in 1 weeks for OB appointment,

## 2014-07-25 ENCOUNTER — Telehealth: Payer: Self-pay | Admitting: Women's Health

## 2014-07-25 NOTE — Telephone Encounter (Signed)
Pt states she was here yesterday and saw Dr. Despina HiddenEure, she believes she lost her mucus plug today.  Pt reports good FM, some increase in pressure, no contractions and no loss of fluids.  Advised pt to monitor her symptoms, if has contractions 5 - 7 minutes apart or sudden gush of fluids, develops vaginal bleeding [facility]  If pain/pressure increases or has a decrease in FM give us a call back.  Pt verbalized understanding.

## 2014-07-27 ENCOUNTER — Encounter: Payer: Self-pay | Admitting: Obstetrics & Gynecology

## 2014-07-27 ENCOUNTER — Ambulatory Visit (INDEPENDENT_AMBULATORY_CARE_PROVIDER_SITE_OTHER): Payer: Medicaid Other | Admitting: Obstetrics & Gynecology

## 2014-07-27 ENCOUNTER — Telehealth: Payer: Self-pay | Admitting: Advanced Practice Midwife

## 2014-07-27 VITALS — BP 140/80 | HR 88 | Wt 174.0 lb

## 2014-07-27 DIAGNOSIS — Z3483 Encounter for supervision of other normal pregnancy, third trimester: Secondary | ICD-10-CM

## 2014-07-27 DIAGNOSIS — Z331 Pregnant state, incidental: Secondary | ICD-10-CM

## 2014-07-27 DIAGNOSIS — Z3403 Encounter for supervision of normal first pregnancy, third trimester: Secondary | ICD-10-CM

## 2014-07-27 DIAGNOSIS — Z1389 Encounter for screening for other disorder: Secondary | ICD-10-CM

## 2014-07-27 LAB — POCT URINALYSIS DIPSTICK
Glucose, UA: NEGATIVE
Ketones, UA: NEGATIVE
NITRITE UA: NEGATIVE

## 2014-07-27 NOTE — Progress Notes (Signed)
SSE mucous No pooling negative nitrazine No discomfort at rest with breathing  G2P0010 170w0d Estimated Date of Delivery: 08/03/14  Blood pressure 140/80, pulse 88, weight 174 lb (78.926 kg), last menstrual period 10/07/2013.   BP weight and urine results all reviewed and noted.  Please refer to the obstetrical flow sheet for the fundal height and fetal heart rate documentation:  Patient reports good fetal movement, denies any bleeding and no rupture of membranes symptoms or regular contractions. Patient is without complaints. All questions were answered.  Plan:  Continued routine obstetrical care,   Follow up in 1 weeks for OB appointment,

## 2014-07-27 NOTE — Telephone Encounter (Signed)
Pt states woke up this am with a large amount of clear water discharge in under garment and on the bed. Has continued to have the discharge through out the day but now is c/o cramping. Pt was given an appt today to be evaluated.

## 2014-07-31 ENCOUNTER — Inpatient Hospital Stay (HOSPITAL_COMMUNITY)
Admission: AD | Admit: 2014-07-31 | Discharge: 2014-07-31 | Disposition: A | Discharge status: Home / Self Care | Payer: Medicaid Other | Source: Ambulatory Visit | Attending: Obstetrics and Gynecology | Admitting: Obstetrics and Gynecology

## 2014-07-31 ENCOUNTER — Telehealth: Payer: Self-pay | Admitting: Obstetrics & Gynecology

## 2014-07-31 ENCOUNTER — Encounter: Payer: Medicaid Other | Admitting: Obstetrics & Gynecology

## 2014-07-31 ENCOUNTER — Encounter (HOSPITAL_COMMUNITY): Payer: Self-pay | Admitting: *Deleted

## 2014-07-31 DIAGNOSIS — O471 False labor at or after 37 completed weeks of gestation: Secondary | ICD-10-CM

## 2014-07-31 DIAGNOSIS — Z3A39 39 weeks gestation of pregnancy: Secondary | ICD-10-CM | POA: Diagnosis not present

## 2014-07-31 DIAGNOSIS — O368130 Decreased fetal movements, third trimester, not applicable or unspecified: Secondary | ICD-10-CM | POA: Diagnosis not present

## 2014-07-31 DIAGNOSIS — Z3403 Encounter for supervision of normal first pregnancy, third trimester: Secondary | ICD-10-CM

## 2014-07-31 NOTE — Telephone Encounter (Signed)
Spoke with pt. Pt states she started having contractions last pm. Having contractions every 10 minutes. No spotting. ? Leaking fluid. Decreased fetal movement. [facility] Pt voiced understanding. JSY

## 2014-07-31 NOTE — Discharge Instructions (Signed)
Braxton Hicks Contractions °Contractions of the uterus can occur throughout pregnancy. Contractions are not always a sign that you are in labor.  °WHAT ARE BRAXTON HICKS CONTRACTIONS?  °Contractions that occur before labor are called Braxton Hicks contractions, or false labor. Toward the end of pregnancy (32-34 weeks), these contractions can develop more often and may become more forceful. This is not true labor because these contractions do not result in opening (dilatation) and thinning of the cervix. They are sometimes difficult to tell apart from true labor because these contractions can be forceful and people have different pain tolerances. You should not feel embarrassed if you go to the hospital with false labor. Sometimes, the only way to tell if you are in true labor is for your health care provider to look for changes in the cervix. °If there are no prenatal problems or other health problems associated with the pregnancy, it is completely safe to be sent home with false labor and await the onset of true labor. °HOW CAN YOU TELL THE DIFFERENCE BETWEEN TRUE AND FALSE LABOR? °False Labor °· The contractions of false labor are usually shorter and not as hard as those of true labor.   °· The contractions are usually irregular.   °· The contractions are often felt in the front of the lower abdomen and in the groin.   °· The contractions may go away when you walk around or change positions while lying down.   °· The contractions get weaker and are shorter lasting as time goes on.   °· The contractions do not usually become progressively stronger, regular, and closer together as with true labor.   °True Labor °· Contractions in true labor last 30-70 seconds, become very regular, usually become more intense, and increase in frequency.   °· The contractions do not go away with walking.   °· The discomfort is usually felt in the top of the uterus and spreads to the lower abdomen and low back.   °· True labor can be  determined by your health care provider with an exam. This will show that the cervix is dilating and getting thinner.   °WHAT TO REMEMBER °· Keep up with your usual exercises and follow other instructions given by your health care provider.   °· Take medicines as directed by your health care provider.   °· Keep your regular prenatal appointments.   °· Eat and drink lightly if you think you are going into labor.   °· If Braxton Hicks contractions are making you uncomfortable:   °¨ Change your position from lying down or resting to walking, or from walking to resting.   °¨ Sit and rest in a tub of warm water.   °¨ Drink 2-3 glasses of water. Dehydration may cause these contractions.   °¨ Do slow and deep breathing several times an hour.   °WHEN SHOULD I SEEK IMMEDIATE MEDICAL CARE? °Seek immediate medical care if: °· Your contractions become stronger, more regular, and closer together.   °· You have fluid leaking or gushing from your vagina.   °· You have a fever.   °· You pass blood-tinged mucus.   °· You have vaginal bleeding.   °· You have continuous abdominal pain.   °· You have low back pain that you never had before.   °· You feel your baby's head pushing down and causing pelvic pressure.   °· Your baby is not moving as much as it used to.   °Document Released: 03/31/2005 Document Revised: 04/05/2013 Document Reviewed: 01/10/2013 °Exit[employer]® Patient Information ©2015 [employer]. This information is not intended to replace advice given to you by your health care   provider. Make sure you discuss any questions you have with your health care provider. ° °

## 2014-07-31 NOTE — MAU Provider Note (Signed)
  History    G2P0010 at 39.4 wks in with c/o contractions since yesterday. GBS neg CSN: [id number]  Arrival date and time: 07/31/14 1744   First Provider Initiated Contact with Patient 07/31/14 1813      Chief Complaint  Patient presents with  . Labor Eval   HPI  OB History    Gravida Para Term Preterm AB TAB SAB Ectopic Multiple Living   2    1  1    0      Past Medical History  Diagnosis Date  . Genital warts   . Vaginal discharge 03/08/2013  . Yeast infection 03/23/2013  . Contraceptive management 03/23/2013  . Miscarriage   . Positive pregnancy test 01/18/2014  . Pregnant 01/18/2014  . Urinary frequency 01/18/2014  . Missed periods 01/18/2014  . Supervision of normal pregnancy in first trimester 01/25/2014    Past Surgical History  Procedure Laterality Date  . Ankle fracture surgery      Family History  Problem Relation Age of Onset  . Cancer Paternal Grandmother   . COPD Maternal Grandmother   . Diabetes Father     History  Substance Use Topics  . Smoking status: Never Smoker   . Smokeless tobacco: Never Used  . Alcohol Use: No    Allergies: No Known Allergies  Prescriptions prior to admission  Medication Sig Dispense Refill Last Dose  . Ca Carbonate-Mag Hydroxide (ROLAIDS PO) Take by mouth.   Taking  . Prenat-FeFum-FePo-FA-Omega 3 (CONCEPT DHA) 53.5-38-1 MG CAPS Take 1 capsule by mouth daily. 30 capsule 11 Taking    Review of Systems  Constitutional: Negative.   HENT: Negative.   Eyes: Negative.   Respiratory: Negative.   Cardiovascular: Negative.   Gastrointestinal: Positive for abdominal pain.  Genitourinary: Negative.   Musculoskeletal: Negative.   Skin: Negative.   Neurological: Negative.   Endo/Heme/Allergies: Negative.   Psychiatric/Behavioral: Negative.    Physical Exam   Height 5\' 1"  (1.549 m), weight 174 lb 3.2 oz (79.017 kg), last menstrual period 10/07/2013.  Physical Exam  Constitutional: She is oriented to person, place,  and time. She appears well-developed and well-nourished.  HENT:  Head: Normocephalic.  Eyes: Pupils are equal, round, and reactive to light.  Cardiovascular: Normal rate, regular rhythm, normal heart sounds and intact distal pulses.   Respiratory: Effort normal and breath sounds normal.  GI: Soft. Bowel sounds are normal.  Genitourinary: Vagina normal and uterus normal.  Musculoskeletal: Normal range of motion.  Neurological: She is alert and oriented to person, place, and time. She has normal reflexes.  Skin: Skin is warm and dry.  Psychiatric: She has a normal mood and affect. Her behavior is normal. Judgment and thought content normal.    MAU Course  Procedures  MDM False vs early labor  Assessment and Plan  D/c home with reassurring strip  LAWSON, MARIE DARLENE 07/31/2014, 6:19 PM

## 2014-07-31 NOTE — MAU Note (Deleted)
Sent from U/S for 6of10 BPP, feeling contractions every 5-10 minutes. Denies bleeding. No LOF. Positive fetal movement  

## 2014-07-31 NOTE — MAU Note (Signed)
Contractions every 10 minutes. Denies bright red vaginal bleeding.  Positive bloody show.  Decreased fetal movement.  Denies SROM/LOF  Denies any Complications of pregnancy  GBS negative per patient

## 2014-08-01 ENCOUNTER — Inpatient Hospital Stay (HOSPITAL_COMMUNITY)
Admission: AD | Admit: 2014-08-01 | Discharge: 2014-08-01 | Disposition: A | Discharge status: Home / Self Care | Payer: Medicaid Other | Source: Ambulatory Visit | Attending: Obstetrics & Gynecology | Admitting: Obstetrics & Gynecology

## 2014-08-01 ENCOUNTER — Encounter (HOSPITAL_COMMUNITY): Payer: Self-pay | Admitting: *Deleted

## 2014-08-01 ENCOUNTER — Telehealth: Payer: Self-pay | Admitting: Women's Health

## 2014-08-01 DIAGNOSIS — Z833 Family history of diabetes mellitus: Secondary | ICD-10-CM | POA: Diagnosis not present

## 2014-08-01 DIAGNOSIS — Z3A39 39 weeks gestation of pregnancy: Secondary | ICD-10-CM

## 2014-08-01 DIAGNOSIS — O1403 Mild to moderate pre-eclampsia, third trimester: Principal | ICD-10-CM | POA: Diagnosis present

## 2014-08-01 NOTE — MAU Note (Signed)
Started contracting at 0330, started counting around 0730. Like a bad menstrual cramp.  ? Leaking. No bleeding.  Was 1-2/70.

## 2014-08-01 NOTE — Telephone Encounter (Signed)
Pt c/o contractions 6-7 minutes apart for 2/3 hours, constant back lower back pain, decrease FM. [facility] Pt states contractions are more intense then they were yesterday. [facility] Pt verbalized understanding.

## 2014-08-01 NOTE — Progress Notes (Signed)
Reviewed fetal tracing with Dr. Adrian BlackwaterStinson; agrees with plan to discharge home with labor precautions.

## 2014-08-02 ENCOUNTER — Inpatient Hospital Stay (HOSPITAL_COMMUNITY): Payer: Medicaid Other | Admitting: Anesthesiology

## 2014-08-02 ENCOUNTER — Encounter (HOSPITAL_COMMUNITY): Payer: Self-pay

## 2014-08-02 ENCOUNTER — Inpatient Hospital Stay (HOSPITAL_COMMUNITY)
Admission: AD | Admit: 2014-08-02 | Discharge: 2014-08-05 | DRG: 774 | Disposition: A | Discharge status: Home / Self Care | Payer: Medicaid Other | Source: Ambulatory Visit | Attending: Family Medicine | Admitting: Family Medicine

## 2014-08-02 DIAGNOSIS — Z833 Family history of diabetes mellitus: Secondary | ICD-10-CM | POA: Diagnosis not present

## 2014-08-02 DIAGNOSIS — O1403 Mild to moderate pre-eclampsia, third trimester: Secondary | ICD-10-CM | POA: Diagnosis present

## 2014-08-02 DIAGNOSIS — O139 Gestational [pregnancy-induced] hypertension without significant proteinuria, unspecified trimester: Secondary | ICD-10-CM | POA: Diagnosis present

## 2014-08-02 DIAGNOSIS — Z3A39 39 weeks gestation of pregnancy: Secondary | ICD-10-CM | POA: Diagnosis present

## 2014-08-02 DIAGNOSIS — O9989 Other specified diseases and conditions complicating pregnancy, childbirth and the puerperium: Secondary | ICD-10-CM | POA: Diagnosis present

## 2014-08-02 LAB — RPR: RPR Ser Ql: NONREACTIVE

## 2014-08-02 LAB — TYPE AND SCREEN
ABO/RH(D): O POS
Antibody Screen: NEGATIVE

## 2014-08-02 LAB — PROTEIN / CREATININE RATIO, URINE
CREATININE, URINE: 96 mg/dL
Protein Creatinine Ratio: 1.01 — ABNORMAL HIGH (ref 0.00–0.15)
Total Protein, Urine: 97 mg/dL

## 2014-08-02 LAB — CBC
HCT: 32.5 % — ABNORMAL LOW (ref 36.0–46.0)
HEMATOCRIT: 31.9 % — AB (ref 36.0–46.0)
HEMOGLOBIN: 10.9 g/dL — AB (ref 12.0–15.0)
HEMOGLOBIN: 10.7 g/dL — AB (ref 12.0–15.0)
MCH: 29.2 pg (ref 26.0–34.0)
MCH: 29.2 pg (ref 26.0–34.0)
MCHC: 33.5 g/dL (ref 30.0–36.0)
MCHC: 33.5 g/dL (ref 30.0–36.0)
MCV: 87.1 fL (ref 78.0–100.0)
MCV: 86.9 fL (ref 78.0–100.0)
Platelets: 150 10*3/uL (ref 150–400)
Platelets: 145 10*3/uL — ABNORMAL LOW (ref 150–400)
RBC: 3.73 MIL/uL — AB (ref 3.87–5.11)
RBC: 3.67 MIL/uL — ABNORMAL LOW (ref 3.87–5.11)
RDW: 14.2 % (ref 11.5–15.5)
RDW: 14.4 % (ref 11.5–15.5)
WBC: 15.9 10*3/uL — ABNORMAL HIGH (ref 4.0–10.5)
WBC: 18 10*3/uL — AB (ref 4.0–10.5)

## 2014-08-02 LAB — ABO/RH: ABO/RH(D): O POS

## 2014-08-02 LAB — HIV ANTIBODY (ROUTINE TESTING W REFLEX): HIV SCREEN 4TH GENERATION: NONREACTIVE

## 2014-08-02 LAB — COMPREHENSIVE METABOLIC PANEL
ALBUMIN: 3 g/dL — AB (ref 3.5–5.2)
ALK PHOS: 149 U/L — AB (ref 39–117)
ALT: 8 U/L (ref 0–35)
AST: 20 U/L (ref 0–37)
Anion gap: 9 (ref 5–15)
BILIRUBIN TOTAL: 0.4 mg/dL (ref 0.3–1.2)
BUN: 9 mg/dL (ref 6–23)
CHLORIDE: 105 mmol/L (ref 96–112)
CO2: 21 mmol/L (ref 19–32)
Calcium: 9.5 mg/dL (ref 8.4–10.5)
Creatinine, Ser: 0.51 mg/dL (ref 0.50–1.10)
GFR calc Af Amer: >90 mL/min (ref >90)
Glucose, Bld: 97 mg/dL (ref 70–99)
POTASSIUM: 3.7 mmol/L (ref 3.5–5.1)
Sodium: 135 mmol/L (ref 135–145)
Total Protein: 6.8 g/dL (ref 6.0–8.3)

## 2014-08-02 MED ORDER — FENTANYL 2.5 MCG/ML BUPIVACAINE 1/10 % EPIDURAL INFUSION (WH - ANES)
14.0000 mL/h | INTRAMUSCULAR | Status: DC | PRN
  Administered 2014-08-02 – 2014-08-03 (×4): 14 mL/h via EPIDURAL
  Administered 2014-08-03: 18 mL/h via EPIDURAL
  Filled 2014-08-02 (×4): qty 125

## 2014-08-02 MED ORDER — EPHEDRINE 5 MG/ML INJ
10.0000 mg | INTRAVENOUS | Status: DC | PRN
  Filled 2014-08-02: qty 2

## 2014-08-02 MED ORDER — LACTATED RINGERS IV SOLN
500.0000 mL | INTRAVENOUS | Status: DC | PRN
  Administered 2014-08-02 – 2014-08-03 (×2): 500 mL via INTRAVENOUS

## 2014-08-02 MED ORDER — OXYCODONE-ACETAMINOPHEN 5-325 MG PO TABS: 1.0000 | ORAL_TABLET | ORAL | Status: DC | PRN

## 2014-08-02 MED ORDER — ACETAMINOPHEN 325 MG PO TABS: 650.0000 mg | ORAL_TABLET | ORAL | Status: DC | PRN

## 2014-08-02 MED ORDER — OXYTOCIN 40 UNITS IN LACTATED RINGERS INFUSION - SIMPLE MED
62.5000 mL/h | INTRAVENOUS | Status: DC
  Filled 2014-08-02: qty 1000

## 2014-08-02 MED ORDER — MAGNESIUM SULFATE BOLUS VIA INFUSION
4.0000 g | Freq: Once | INTRAVENOUS | Status: DC
  Filled 2014-08-02: qty 500

## 2014-08-02 MED ORDER — OXYTOCIN BOLUS FROM INFUSION
500.0000 mL | INTRAVENOUS | Status: DC
  Administered 2014-08-03: 500 mL via INTRAVENOUS

## 2014-08-02 MED ORDER — FENTANYL CITRATE (PF) 100 MCG/2ML IJ SOLN: 100.0000 ug | INTRAMUSCULAR | Status: DC | PRN

## 2014-08-02 MED ORDER — LACTATED RINGERS IV SOLN: 500.0000 mL | Freq: Once | INTRAVENOUS | Status: DC

## 2014-08-02 MED ORDER — DIPHENHYDRAMINE HCL 50 MG/ML IJ SOLN: 12.5000 mg | INTRAMUSCULAR | Status: DC | PRN

## 2014-08-02 MED ORDER — LIDOCAINE HCL (PF) 1 % IJ SOLN
30.0000 mL | INTRAMUSCULAR | Status: DC | PRN
  Filled 2014-08-02: qty 30

## 2014-08-02 MED ORDER — LABETALOL HCL 5 MG/ML IV SOLN: 20.0000 mg | INTRAVENOUS | Status: DC | PRN

## 2014-08-02 MED ORDER — MAGNESIUM SULFATE 40 G IN LACTATED RINGERS - SIMPLE
2.0000 g/h | INTRAVENOUS | Status: DC
  Filled 2014-08-02: qty 500

## 2014-08-02 MED ORDER — CITRIC ACID-SODIUM CITRATE 334-500 MG/5ML PO SOLN
30.0000 mL | ORAL | Status: DC | PRN
  Administered 2014-08-02: 30 mL via ORAL
  Filled 2014-08-02: qty 15

## 2014-08-02 MED ORDER — LACTATED RINGERS IV SOLN
INTRAVENOUS | Status: DC
  Administered 2014-08-02: 1000 mL via INTRAVENOUS
  Administered 2014-08-02 – 2014-08-03 (×3): via INTRAVENOUS

## 2014-08-02 MED ORDER — TERBUTALINE SULFATE 1 MG/ML IJ SOLN
0.2500 mg | Freq: Once | INTRAMUSCULAR | Status: AC | PRN
Start: 2014-08-02 — End: 2014-08-02

## 2014-08-02 MED ORDER — OXYCODONE-ACETAMINOPHEN 5-325 MG PO TABS: 2.0000 | ORAL_TABLET | ORAL | Status: DC | PRN

## 2014-08-02 MED ORDER — PHENYLEPHRINE 40 MCG/ML (10ML) SYRINGE FOR IV PUSH (FOR BLOOD PRESSURE SUPPORT)
80.0000 ug | PREFILLED_SYRINGE | INTRAVENOUS | Status: DC | PRN
  Filled 2014-08-02: qty 2
  Filled 2014-08-02: qty 20

## 2014-08-02 MED ORDER — LIDOCAINE HCL (PF) 1 % IJ SOLN
INTRAMUSCULAR | Status: DC | PRN
  Administered 2014-08-02 (×2): 8 mL
  Administered 2014-08-03: 5 mL
  Administered 2014-08-03 (×2): 4 mL

## 2014-08-02 MED ORDER — HYDRALAZINE HCL 20 MG/ML IJ SOLN: 10.0000 mg | Freq: Once | INTRAMUSCULAR | Status: AC | PRN

## 2014-08-02 MED ORDER — PHENYLEPHRINE 40 MCG/ML (10ML) SYRINGE FOR IV PUSH (FOR BLOOD PRESSURE SUPPORT)
80.0000 ug | PREFILLED_SYRINGE | INTRAVENOUS | Status: DC | PRN
  Filled 2014-08-02 (×2): qty 20
  Filled 2014-08-02: qty 2
  Filled 2014-08-02: qty 20

## 2014-08-02 MED ORDER — OXYTOCIN 40 UNITS IN LACTATED RINGERS INFUSION - SIMPLE MED
1.0000 m[IU]/min | INTRAVENOUS | Status: DC
  Administered 2014-08-02: 1 m[IU]/min via INTRAVENOUS

## 2014-08-02 MED ORDER — ONDANSETRON HCL 4 MG/2ML IJ SOLN: 4.0000 mg | Freq: Four times a day (QID) | INTRAMUSCULAR | Status: DC | PRN

## 2014-08-02 NOTE — Progress Notes (Signed)
Anna Hartman is a 24 y.o. G2P0010 at [redacted]w[redacted]d by  admitted for induction of labor due to Mild Preeclampsia.  Subjective: Pt uncomfortable with contractions.   Objective: BP 137/81 mmHg  Pulse 84  Temp(Src) 98.6 F (37 C) (Oral)  Resp 18  Ht 5\' 1"  (1.549 m)  Wt 78.019 kg (172 lb)  BMI 32.52 kg/m2  SpO2 99%  LMP 10/07/2013    Arom with amnihook clear fluid. IUPC placed without difficulty. Fhr noted a prolonged decleration for 5 minutes after iupc placed. Pitocin off and multiple position changes to stabalize FHR to 14's with mod variability. Dr Despina HiddenEure called to assess and came to room to evaluate pt.    Dilation: 4 Effacement (%): 90 Station: -2 Exam by:: Stryker CorporationLori Hartman  Labs: Lab Results  Component Value Date   WBC 15.9* 08/02/2014   HGB 10.9* 08/02/2014   HCT 32.5* 08/02/2014   MCV 87.1 08/02/2014   PLT 150 08/02/2014    Assessment / Plan:   Labor: Progressing normally Preeclampsia:  b/p stable Fetal Wellbeing:  Category II Pain Control:  Labor support without medications I/D:  n/a Anticipated MOD:  NSVD  Anna Hartman 08/02/2014, 7:53 PM

## 2014-08-02 NOTE — Anesthesia Preprocedure Evaluation (Signed)
Anesthesia Evaluation  Patient identified by MRN, date of birth, ID band Patient awake    Reviewed: Allergy & Precautions, H&P , NPO status , Patient's Chart, lab work & pertinent test results  Airway Mallampati: II TM Distance: >3 FB Neck ROM: full    Dental no notable dental hx.    Pulmonary neg pulmonary ROS,    Pulmonary exam normal       Cardiovascular negative cardio ROS      Neuro/Psych negative neurological ROS  negative psych ROS   GI/Hepatic negative GI ROS, Neg liver ROS,   Endo/Other  negative endocrine ROS  Renal/GU negative Renal ROS     Musculoskeletal   Abdominal Normal abdominal exam  (+)   Peds  Hematology negative hematology ROS (+)   Anesthesia Other Findings   Reproductive/Obstetrics (+) Pregnancy                           Anesthesia Physical Anesthesia Plan  ASA: II  Anesthesia Plan: Epidural   Post-op Pain Management:    Induction:   Airway Management Planned:   Additional Equipment:   Intra-op Plan:   Post-operative Plan:   Informed Consent: I have reviewed the patients History and Physical, chart, labs and discussed the procedure including the risks, benefits and alternatives for the proposed anesthesia with the patient or authorized representative who has indicated his/her understanding and acceptance.     Plan Discussed with:   Anesthesia Plan Comments:         Anesthesia Quick Evaluation  

## 2014-08-02 NOTE — Progress Notes (Signed)
Anna Hartman is a 24 y.o. G2P0010 at 6438w6d by admitted for Preeclampsia with no severe features.  Subjective: Pt uncomfortable with contractions; Undecided if she wants pain medicine at this time  Objective: BP 127/70 mmHg  Pulse 85  Temp(Src) 98 F (36.7 C) (Oral)  Resp 18  Ht 5\' 1"  (1.549 m)  Wt 78.019 kg (172 lb)  BMI 32.52 kg/m2  SpO2 98%  LMP 10/07/2013      FHT:  Fht's 135. Mod variaiblity; occassional mild variable with contractions ; reassurring UC:   irregular, every 2-5 minutes SVE:   Dilation: 2 Effacement (%): 80 Cervical Position: Posterior Station: -2 Presentation: Vertex Exam by:: L Anna Hartman, CNM   Labs: Lab Results  Component Value Date   WBC 15.9* 08/02/2014   HGB 10.9* 08/02/2014   HCT 32.5* 08/02/2014   MCV 87.1 08/02/2014   PLT 150 08/02/2014    Assessment / Plan: will start pitocin  Labor: Stage 1 Preeclampsia:  no signs or symptoms of toxicity Fetal Wellbeing:  Category II Pain Control:  Labor support without medications I/D:   Anticipated MOD:  NSVD  Anna Hartman 08/02/2014, 10:06 AM

## 2014-08-02 NOTE — H&P (Signed)
LABOR ADMISSION HISTORY AND PHYSICAL  Anna Hartman is a 24 y.o. female G2P0010 with IUP at 3674w6d 7440w6d sono presenting for labor evaluation. She reports +FMs, No LOF, no VB, no blurry vision, and RUQ pain.  She reported headache earlier this morning but denied headache during interview, does note increased LE swelling.  She plans on breast feeding. She request nexplanon for birth control.  Dating: By 6540w6d --->  Estimated Date of Delivery: 08/03/14  Prenatal History/Complications:  Past Medical History: Past Medical History  Diagnosis Date  . Genital warts   . Vaginal discharge 03/08/2013  . Yeast infection 03/23/2013  . Contraceptive management 03/23/2013  . Miscarriage   . Positive pregnancy test 01/18/2014  . Pregnant 01/18/2014  . Urinary frequency 01/18/2014  . Missed periods 01/18/2014  . Supervision of normal pregnancy in first trimester 01/25/2014    Past Surgical History: Past Surgical History  Procedure Laterality Date  . Ankle fracture surgery      Obstetrical History: OB History    Gravida Para Term Preterm AB TAB SAB Ectopic Multiple Living   2    1  1    0      Social History: History   Social History  . Marital Status: Single    Spouse Name: N/A  . Number of Children: N/A  . Years of Education: N/A   Social History Main Topics  . Smoking status: Never Smoker   . Smokeless tobacco: Never Used  . Alcohol Use: No  . Drug Use: No  . Sexual Activity: Not Currently    Birth Control/ Protection: None   Other Topics Concern  . None   Social History Narrative    Family History: Family History  Problem Relation Age of Onset  . Cancer Paternal Grandmother   . COPD Maternal Grandmother   . Diabetes Father     Allergies: No Known Allergies  Prescriptions prior to admission  Medication Sig Dispense Refill Last Dose  . acetaminophen (TYLENOL) 500 MG tablet Take 1,000 mg by mouth every 6 (six) hours as needed for headache.   Past Month at Unknown  time  . Ca Carbonate-Mag Hydroxide (ROLAIDS PO) Take 1-2 tablets by mouth 2 (two) times daily as needed (For heartburn.).    08/01/2014 at Unknown time  . Prenat-FeFum-FePo-FA-Omega 3 (CONCEPT DHA) 53.5-38-1 MG CAPS Take 1 capsule by mouth daily. 30 capsule 11 08/01/2014 at Unknown time     Review of Systems   All systems reviewed and negative except as stated in HPI  Blood pressure 146/88, pulse 84, temperature 98 F (36.7 C), temperature source Oral, resp. rate 20, height 5' (1.524 m), weight 173 lb (78.472 kg), last menstrual period 10/07/2013, SpO2 98 %. General appearance: alert and cooperative Lungs: clear to auscultation bilaterally Heart: regular rate and rhythm Abdomen: soft, non-tender; bowel sounds normal Extremities: Homans sign is negative, no sign of DVT DTR's 3+, 1 beat of clonus Presentation: cephalic  Cat II: few variable decelerations  Dilation: 2 Effacement (%): 80 Station: -2 Exam by:: Remigio EisenmengerBenji Stanley RN  Prenatal labs: ABO, Rh: O/POS/-- (10/14 1543) Antibody: NEG (01/13 0918) Rubella:   RPR: NON REAC (01/13 0918)  HBsAg: NEGATIVE (10/14 1543)  HIV: NONREACTIVE (01/13 [id number])  GBS: Negative (03/30 0000)  Glucola normal 2h Genetic screening  normal Anatomy US normal   [facility]  FOB Alex GardenerMichael Mata 24 yo Hispanic 2nd child  Dating By US 1st trimester  Pap 01/25/14 normal  GC/CT Initial:      -/-  36+wks:neg/neg  Genetic Screen NT/IT: normal  CF screen declined  Anatomic [place] Normal female   Flu vaccine 02/22/2014  Tdap Recommended ~ 28wks  Glucose Screen  2 hr 73/119/114  GBS neg  Feed Preference breast  Contraception nexplanon  Circumcision n/a  Childbirth Classes Recommended, info given- didn't make it- recommended tour  Pediatrician Undecided, info given     Prenatal Transfer Tool  Maternal Diabetes: No Genetic Screening: Normal Maternal Ultrasounds/Referrals: Normal Fetal Ultrasounds or other Referrals:  None Maternal  Substance Abuse:  No Significant Maternal Medications:  None Significant Maternal Lab Results: None  Results for orders placed or performed during the hospital encounter of 08/02/14 (from the past 24 hour(s))  CBC   Collection Time: 08/02/14  5:35 AM  Result Value Ref Range   WBC 15.9 (H) 4.0 - 10.5 K/uL   RBC 3.73 (L) 3.87 - 5.11 MIL/uL   Hemoglobin 10.9 (L) 12.0 - 15.0 g/dL   HCT 47.8 (L) 29.5 - 62.1 %   MCV 87.1 78.0 - 100.0 fL   MCH 29.2 26.0 - 34.0 pg   MCHC 33.5 30.0 - 36.0 g/dL   RDW 30.8 65.7 - 84.6 %   Platelets 150 150 - 400 K/uL    Patient Active Problem List   Diagnosis Date Noted  . Supervision of normal first pregnancy 01/25/2014    Assessment: Anna Hartman is a 24 y.o. G2P0010 at [redacted]w[redacted]d here for IOL for gestational hypertension after presenting for labor evaluation  #Labor: start pitocin for augmentation #Pain: Epidural upon request #FWB: Cat I #ID:  GBS neg #MOF: breast #MOC:nexplanon #preE with mild features: will mag for any severe range BP, discussed with Dr. Adrian Blackwater, no indication for mag at this time.  Marykay Mccleod ROCIO 08/02/2014, 6:11 AM

## 2014-08-02 NOTE — MAU Note (Signed)
Pt reports contractions. Some bleeding earlier.

## 2014-08-02 NOTE — Anesthesia Procedure Notes (Addendum)
Epidural Patient location during procedure: OB Start time: 08/02/2014 9:39 PM End time: 08/02/2014 9:43 PM  Staffing Anesthesiologist: Leilani AbleHATCHETT, FRANKLIN Performed by: anesthesiologist   Preanesthetic Checklist Completed: patient identified, surgical consent, pre-op evaluation, timeout performed, IV checked, risks and benefits discussed and monitors and equipment checked  Epidural Patient position: sitting Prep: site prepped and draped and DuraPrep Patient monitoring: continuous pulse ox and blood pressure Approach: midline Location: L3-L4 Injection technique: LOR air  Needle:  Needle type: Tuohy  Needle gauge: 17 G Needle length: 9 cm and 9 Needle insertion depth: 5 cm cm Catheter type: closed end flexible Catheter size: 19 Gauge Catheter at skin depth: 10 cm Test dose: negative and Other  Assessment Sensory level: T9 Events: blood not aspirated, injection not painful, no injection resistance, negative IV test and no paresthesia  Additional Notes Reason for block:procedure for pain  Epidural Patient location during procedure: OB  Preanesthetic Checklist Completed: patient identified, site marked, surgical consent, pre-op evaluation, timeout performed, IV checked, risks and benefits discussed and monitors and equipment checked  Epidural Patient position: sitting Prep: site prepped and draped and DuraPrep Patient monitoring: continuous pulse ox and blood pressure Approach: midline Location: L1-L2 Injection technique: LOR air  Needle:  Needle type: Tuohy  Needle gauge: 17 G Needle length: 9 cm and 9 Needle insertion depth: 5 cm cm Catheter type: closed end flexible Catheter size: 19 Gauge Catheter at skin depth: 10 cm Test dose: negative  Assessment Events: blood not aspirated, injection not painful, no injection resistance, negative IV test and no paresthesia  Additional Notes Dosing of Epidural:  1st dose, through catheter  ............................................Marland Kitchen. Xylocaine 40 mg  2nd dose, through catheter, after waiting 3 minutes.......Marland Kitchen.Xylocaine 40 mg   ( 1% Xylo charted as a single dose in Epic Meds for ease of charting; actual dosing was fractionated as above, for saftey's sake)  As each dose occurred, patient was free of IV sx; and patient exhibited no evidence of SA injection.  Patient is more comfortable after epidural dosed. Please see RN's note for documentation of vital signs,and FHR which are stable.  Patient reminded not to try to ambulate with numb legs, and that an RN must be present the 1st time she attempts to get up.

## 2014-08-03 ENCOUNTER — Encounter: Payer: Medicaid Other | Admitting: Obstetrics & Gynecology

## 2014-08-03 ENCOUNTER — Encounter (HOSPITAL_COMMUNITY): Payer: Self-pay | Admitting: *Deleted

## 2014-08-03 DIAGNOSIS — O1403 Mild to moderate pre-eclampsia, third trimester: Secondary | ICD-10-CM

## 2014-08-03 DIAGNOSIS — Z833 Family history of diabetes mellitus: Secondary | ICD-10-CM

## 2014-08-03 MED ORDER — METHYLERGONOVINE MALEATE 0.2 MG/ML IJ SOLN: 0.2000 mg | INTRAMUSCULAR | Status: DC | PRN

## 2014-08-03 MED ORDER — ACETAMINOPHEN 325 MG PO TABS: 650.0000 mg | ORAL_TABLET | ORAL | Status: DC | PRN

## 2014-08-03 MED ORDER — DIPHENHYDRAMINE HCL 25 MG PO CAPS: 25.0000 mg | ORAL_CAPSULE | Freq: Four times a day (QID) | ORAL | Status: DC | PRN

## 2014-08-03 MED ORDER — OXYTOCIN 40 UNITS IN LACTATED RINGERS INFUSION - SIMPLE MED: 62.5000 mL/h | INTRAVENOUS | Status: DC | PRN

## 2014-08-03 MED ORDER — BISACODYL 10 MG RE SUPP: 10.0000 mg | Freq: Every day | RECTAL | Status: DC | PRN

## 2014-08-03 MED ORDER — FLEET ENEMA 7-19 GM/118ML RE ENEM: 1.0000 | ENEMA | Freq: Every day | RECTAL | Status: DC | PRN

## 2014-08-03 MED ORDER — WITCH HAZEL-GLYCERIN EX PADS
1.0000 "application " | MEDICATED_PAD | CUTANEOUS | Status: DC | PRN
  Administered 2014-08-05: 1 via TOPICAL

## 2014-08-03 MED ORDER — SENNOSIDES-DOCUSATE SODIUM 8.6-50 MG PO TABS
2.0000 | ORAL_TABLET | ORAL | Status: DC
  Administered 2014-08-03 – 2014-08-05 (×2): 2 via ORAL
  Filled 2014-08-03 (×2): qty 2

## 2014-08-03 MED ORDER — BENZOCAINE-MENTHOL 20-0.5 % EX AERO: 1.0000 "application " | INHALATION_SPRAY | CUTANEOUS | Status: DC | PRN

## 2014-08-03 MED ORDER — LORAZEPAM 2 MG/ML IJ SOLN
1.0000 mg | Freq: Once | INTRAMUSCULAR | Status: DC | PRN
  Filled 2014-08-03: qty 0.5

## 2014-08-03 MED ORDER — OXYCODONE-ACETAMINOPHEN 5-325 MG PO TABS: 2.0000 | ORAL_TABLET | ORAL | Status: DC | PRN

## 2014-08-03 MED ORDER — IBUPROFEN 600 MG PO TABS
600.0000 mg | ORAL_TABLET | Freq: Four times a day (QID) | ORAL | Status: DC
  Administered 2014-08-03 – 2014-08-05 (×6): 600 mg via ORAL
  Filled 2014-08-03 (×7): qty 1

## 2014-08-03 MED ORDER — MEASLES, MUMPS & RUBELLA VAC ~~LOC~~ INJ: 0.5000 mL | INJECTION | Freq: Once | SUBCUTANEOUS | Status: DC

## 2014-08-03 MED ORDER — DIBUCAINE 1 % RE OINT: 1.0000 "application " | TOPICAL_OINTMENT | RECTAL | Status: DC | PRN

## 2014-08-03 MED ORDER — SIMETHICONE 80 MG PO CHEW: 80.0000 mg | CHEWABLE_TABLET | ORAL | Status: DC | PRN

## 2014-08-03 MED ORDER — ONDANSETRON HCL 4 MG/2ML IJ SOLN: 4.0000 mg | INTRAMUSCULAR | Status: DC | PRN

## 2014-08-03 MED ORDER — LANOLIN HYDROUS EX OINT: TOPICAL_OINTMENT | CUTANEOUS | Status: DC | PRN

## 2014-08-03 MED ORDER — LORAZEPAM 2 MG/ML IJ SOLN
1.0000 mg | Freq: Once | INTRAMUSCULAR | Status: DC
  Filled 2014-08-03: qty 0.5

## 2014-08-03 MED ORDER — OXYCODONE-ACETAMINOPHEN 5-325 MG PO TABS: 1.0000 | ORAL_TABLET | ORAL | Status: DC | PRN

## 2014-08-03 MED ORDER — BUPIVACAINE HCL (PF) 0.25 % IJ SOLN
INTRAMUSCULAR | Status: DC | PRN
  Administered 2014-08-03: 5 mL
  Administered 2014-08-03: 3 mL

## 2014-08-03 MED ORDER — ZOLPIDEM TARTRATE 5 MG PO TABS: 5.0000 mg | ORAL_TABLET | Freq: Every evening | ORAL | Status: DC | PRN

## 2014-08-03 MED ORDER — PRENATAL MULTIVITAMIN CH: 1.0000 | ORAL_TABLET | Freq: Every day | ORAL | Status: DC

## 2014-08-03 MED ORDER — METHYLERGONOVINE MALEATE 0.2 MG PO TABS: 0.2000 mg | ORAL_TABLET | ORAL | Status: DC | PRN

## 2014-08-03 MED ORDER — SODIUM CHLORIDE 0.9 % IV SOLN
INTRAVENOUS | Status: DC
  Administered 2014-08-03: 17:00:00 via EPIDURAL
  Filled 2014-08-03: qty 10

## 2014-08-03 MED ORDER — FERROUS SULFATE 325 (65 FE) MG PO TABS
325.0000 mg | ORAL_TABLET | Freq: Two times a day (BID) | ORAL | Status: DC
  Administered 2014-08-04 – 2014-08-05 (×3): 325 mg via ORAL
  Filled 2014-08-03 (×3): qty 1

## 2014-08-03 MED ORDER — PRENATAL MULTIVITAMIN CH
1.0000 | ORAL_TABLET | Freq: Every day | ORAL | Status: DC
  Administered 2014-08-04: 1 via ORAL
  Filled 2014-08-03: qty 1

## 2014-08-03 MED ORDER — ONDANSETRON HCL 4 MG PO TABS: 4.0000 mg | ORAL_TABLET | ORAL | Status: DC | PRN

## 2014-08-03 MED ORDER — TETANUS-DIPHTH-ACELL PERTUSSIS 5-2.5-18.5 LF-MCG/0.5 IM SUSP
0.5000 mL | Freq: Once | INTRAMUSCULAR | Status: AC
  Administered 2014-08-04: 0.5 mL via INTRAMUSCULAR
  Filled 2014-08-03: qty 0.5

## 2014-08-03 MED ORDER — SODIUM BICARBONATE 8.4 % IV SOLN
INTRAVENOUS | Status: DC | PRN
  Administered 2014-08-03: 5 mL via EPIDURAL
  Administered 2014-08-03: 3 mL via EPIDURAL
  Administered 2014-08-03: 7 mL via EPIDURAL

## 2014-08-03 NOTE — Progress Notes (Signed)
Labor Progress Note  S: very uncomfortable, reporting pressure and pain with contractions despite epidural  O:  BP 138/78 mmHg  Pulse 102  Temp(Src) 97.7 F (36.5 C) (Oral)  Resp 18  Ht 5\' 1"  (1.549 m)  Wt 172 lb (78.019 kg)  BMI 32.52 kg/m2  SpO2 99%  LMP 10/07/2013 Cat II CVE: 6-7/80/-1, no 8-9cm as previously thought  A&P: 24 y.o. G2P0010 2833w0d presenting initially for labor evaluation but admitted 2/2 preE with mild fx # labor: expectant mgmt, IUPC replaced # pain: anesthesia to be notified  [name],Deeanna Beightol ROCIO, MD 1:32 PM

## 2014-08-03 NOTE — Progress Notes (Signed)
Anna Hartman is a 24 y.o. G2P0010 at 3669w0d   Subjective:   Objective: BP 112/77 mmHg  Pulse 101  Temp(Src) 99.1 F (37.3 C) (Oral)  Resp 18  Ht 5\' 1"  (1.549 m)  Wt 78.019 kg (172 lb)  BMI 32.52 kg/m2  SpO2 98%  LMP 10/07/2013      FHT:  FHR: 140-150 bpm, variability: moderate,  accelerations:  Present,  decelerations:  Absent; occ mi variables UC:   irregular, every 3-5 minutes, inadequate MVUs w/ Pit @ 635mu/min SVE:   Dilation: 6 Effacement (%): 90 Station: -1 Exam by:: Auriel RN   Labs: Lab Results  Component Value Date   WBC 18.0* 08/02/2014   HGB 10.7* 08/02/2014   HCT 31.9* 08/02/2014   MCV 86.9 08/02/2014   PLT 145* 08/02/2014    Assessment / Plan: IOL process Mild preeclampsia- BPs stable  Will recheck cx when MVUs adequate x 2 hrs  Samul Mcinroy 08/03/2014, 2:31 AM

## 2014-08-03 NOTE — Progress Notes (Signed)
Anna Hartman is a 24 y.o. G2P0010 at 4835w0d   Subjective: Feeling rectal pressure & back pain; has rec'd one PCA epidural dose now  Objective: BP 131/57 mmHg  Pulse 103  Temp(Src) 98.6 F (37 C) (Oral)  Resp 18  Ht 5\' 1"  (1.549 m)  Wt 78.019 kg (172 lb)  BMI 32.52 kg/m2  SpO2 100%  LMP 10/07/2013      FHT:  FHR: 140s bpm, variability: moderate,  accelerations:  Present,  decelerations:  Absent- occ mi variables UC:   irregular, every 2-5 minutes w/ coupling; MVUs approx 120 on Pit 8 mu/min SVE:   Dilation: 8.5 Effacement (%): 90, 80 Station: 0, +1 Exam by:: Anna Hartman CNM  Labs: Lab Results  Component Value Date   WBC 18.0* 08/02/2014   HGB 10.7* 08/02/2014   HCT 31.9* 08/02/2014   MCV 86.9 08/02/2014   PLT 145* 08/02/2014    Assessment / Plan: Active labor/transition Preeclampsia- mild  Continue with a total of 3 PCA epid doses to help with discomfort, then anesthesia prn Recheck cx in approx 2 hours or sooner prn  Anna Hartman CNM 08/03/2014, 7:07 AM

## 2014-08-03 NOTE — Progress Notes (Signed)
Labor Progress Note  S: very uncomfortable, screaming and crying with contractions  O:  BP 130/77 mmHg  Pulse 112  Temp(Src) 99 F (37.2 C) (Oral)  Resp 18  Ht 5\' 1"  (1.549 m)  Wt 172 lb (78.019 kg)  BMI 32.52 kg/m2  SpO2 100%  LMP 10/07/2013 Cat II CVE:  0930 6-7/80/-1, no 8-9cm as previously thought 1330 9/80+1 1600 AL/90/+1  A&P: 24 y.o. G2P0010 3237w0d presenting initially for labor evaluation but admitted 2/2 preE with mild fx # labor: continue pitocin, unable to reduce cervix # preE: no severe features, no mag indicated at this time # pain: anesthesia to re-eval, s/p 2nd epidural with redosing  [name],Anna Fanton ROCIO, MD 5:06 PM

## 2014-08-03 NOTE — Progress Notes (Signed)
Labor Progress Note  S: very uncomfortable, recently re-dosed and feeling somewhat better  O:  BP 119/62 mmHg  Pulse 105  Temp(Src) 97.7 F (36.5 C) (Oral)  Resp 18  Ht 5\' 1"  (1.549 m)  Wt 172 lb (78.019 kg)  BMI 32.52 kg/m2  SpO2 90%  LMP 10/07/2013 Cat II CVE:  0930 6-7/80/-1, no 8-9cm as previously thought 1330 9/80+1  A&P: 24 y.o. G2P0010 5272w0d presenting initially for labor evaluation but admitted 2/2 preE with mild fx # labor: expectant mgmt, continue pitocin # preE: no severe features, no mag indicated at this time  [name],Mikolaj Woolstenhulme ROCIO, MD 1:34 PM

## 2014-08-04 NOTE — Lactation Note (Signed)
This note was copied from the chart of Anna Anna Hartman. Lactation Consultation Note  Reviewed hand expression with mom with colostrum now visible. Baby is not giving feeding cues but is alert and looking around.  She also continues to be gaggy.  Plan is to initiate a double electric breast pump to aid in milk production while [facility]. Patient Name: Anna Hartman ZOXWR'UToday's Date: 08/04/2014     Maternal Data    Feeding    LATCH Score/Interventions                      Lactation Tools Discussed/Used     Consult Status      Soyla DryerJoseph, Charlie Char 08/04/2014, 7:45 PM

## 2014-08-04 NOTE — Lactation Note (Addendum)
This note was copied from the chart of Anna Renaldo [name]. Lactation Consultation Note  Patient Name: Anna Hartman [id number]'s Date: 08/04/2014 Reason for consult: Initial assessment  [facility] LC checked diaper and placed baby skin to skin  On moms chest. Also showed mom how to hand express, without yield. Mom reports breast changes  With pregnancy ,size larger, areola darker. Areola semi compressible after attempt  to hand express.  Mom already has hand pump , and LC instructed on the use of shells when baby not skin to skin.  LC recommended to mom to call with feeding cues , also so feeding can be observed, Mother informed of post-discharge support and given phone number to the lactation department, including services  for phone call assistance; out-patient appointments; and breastfeeding support group. List of other breastfeeding resources  in the community given in the handout. Encouraged mother to call for problems or concerns related to breastfeeding.   Maternal Data Has patient been taught Hand Expression?: Yes (attempted and showed mom without EBM yield ) Does the patient have breastfeeding experience prior to this delivery?: No  Feeding Feeding Type:  (baby skin to skin, no feeding cues ) Length of feed: 3 min  LATCH Score/Interventions Latch:  (baby skin to skin , no signs of hunger )              Intervention(s): Breastfeeding basics reviewed     Lactation Tools Discussed/Used [facility]   Consult Status Consult Status: Follow-up Date: 08/04/14 Follow-up type: In-patient    Kathrin Greathouseorio, Ervie Mccard Ann 08/04/2014, 11:28 AM

## 2014-08-04 NOTE — Progress Notes (Signed)
Post Partum Day 1 Subjective:  Anna Hartman is a 24 y.o. G2P1011 1828w0d s/p SVD.  No acute events overnight.  Pt denies problems with ambulating, voiding or po intake.  She denies nausea or vomiting.  Pain is well controlled.  She has had flatus. She has had bowel movement.  Lochia Minimal.  Plan for birth control is Nexplanon.  Method of Feeding: Breast and bottle.  Baby has latched on.   Objective: Blood pressure 100/72, pulse 90, temperature 98.2 F (36.8 C), temperature source Oral, resp. rate 16, height 5\' 1"  (1.549 m), weight 78.019 kg (172 lb), last menstrual period 10/07/2013, SpO2 99 %,  currently breastfeeding.  Physical Exam:  General: alert, cooperative and no distress Lochia:normal flow Chest: CTAB Heart: RRR no m/r/g Abdomen: +BS, soft, nontender,  Uterine Fundus: firm, midline DVT Evaluation: No evidence of DVT seen on physical exam. Extremities: 1+ edema   Recent Labs  08/02/14 0535 08/02/14 2105  HGB 10.9* 10.7*  HCT 32.5* 31.9*    Assessment/Plan:  ASSESSMENT: Anna Hartman is a 24 y.o. G2P1011 628w0d s/p SVD who is stable and doing well  Plan for discharge tomorrow   LOS: 2 days   CRESENZO-DISHMAN,Kerianne Gurr

## 2014-08-04 NOTE — Anesthesia Postprocedure Evaluation (Signed)
  Anesthesia Post-op Note  Patient: Anna Hartman  Procedure(s) Performed: * No procedures listed *  Patient Location: Mother/Baby  Anesthesia Type:Epidural  Level of Consciousness: awake and alert   Airway and Oxygen Therapy: Patient Spontanous Breathing  Post-op Pain: moderate  Post-op Assessment: Post-op Vital signs reviewed, Patient's Cardiovascular Status Stable, Respiratory Function Stable, No signs of Nausea or vomiting, Pain level controlled, No headache, No residual numbness and No residual motor weakness  Post-op Vital Signs: Reviewed  Last Vitals:  Filed Vitals:   08/04/14 0605  BP: 100/72  Pulse: 90  Temp: 36.8 C  Resp: 16    Complications: back pain

## 2014-08-05 MED ORDER — IBUPROFEN 600 MG PO TABS: 600.0000 mg | ORAL_TABLET | Freq: Four times a day (QID) | ORAL | Status: DC

## 2014-08-05 NOTE — Discharge Instructions (Signed)

## 2014-08-05 NOTE — Lactation Note (Addendum)
This note was copied from the chart of Anna Renaldo FiddlerJessica Vandyne. Lactation Consultation Note  Mother called to view latch. Baby latched easily.  Rhythmical sucking observed and swallows heard.  LS9. Baby breastfed for 16 minutes.  Mother is burping baby and will switch sides. Reminded mother to keep feeding STS and massage breast during feeding to keep her active. Mother asked question about a relative wanting to add rice cereal to a bottle when baby is older. Provided explanation of not adding rice cereal to bottle and waiting until approx 6 months before giving food.   Explained about danger of aspiration with rice in bottle.   Patient Name: Anna Renaldo FiddlerJessica Hartman [id number]'s Date: 08/05/2014 Reason for consult: Follow-up assessment   Maternal Data    Feeding Feeding Type: Breast Fed Length of feed: 9 min  LATCH Score/Interventions Latch: Grasps breast easily, tongue down, lips flanged, rhythmical sucking. Intervention(s): Skin to skin  Audible Swallowing: Spontaneous and intermittent  Type of Nipple: Everted at rest and after stimulation Intervention(s): Hand pump  Comfort (Breast/Nipple): Filling, red/small blisters or bruises, mild/mod discomfort  Problem noted: Mild/Moderate discomfort Interventions (Mild/moderate discomfort): Comfort gels;Hand expression  Hold (Positioning): No assistance needed to correctly position infant at breast.  LATCH Score: 9  Lactation Tools Discussed/Used     Consult Status Consult Status: Complete    Hardie PulleyBerkelhammer, Ruth Boschen 08/05/2014, 10:31 AM

## 2014-08-05 NOTE — Discharge Summary (Signed)
Obstetric Discharge Summary Reason for Admission: induction of labor 2/2 gHTN Prenatal Procedures: Preeclampsia and ultrasound Intrapartum Procedures: spontaneous vaginal delivery Postpartum Procedures: TDap  Complications-Operative and Postpartum: vaginal laceration  Delivery Note After about 1 hour of pushing, At 7:16 PM a viable female was delivered via Vaginal, Spontaneous Delivery (Presentation: Middle Occiput Anterior). APGAR: 8/9, ; weight . Pending.. 40 units of pitocin diluted in 1000cc LR was infused rapidly IV. The placenta separated spontaneously and delivered via CCT and maternal pushing effort. It was inspected and appears to be intact with a 3 VC.   Anesthesia: Epidural  Episiotomy: None Lacerations: 1st degree vaginal Suture Repair: n/a Est. Blood Loss (mL): 130  Mom to postpartum. Baby to Couplet care / Skin to Skin   Delivery by Caryn BeeKevin, med student under my direct supervision.  [facility] IncCRESENZO-DISHMAN,FRANCES  Hospital Course:  Active Problems:   Gestational hypertension   SVD (spontaneous vaginal delivery)   [name] is a 24 y.o. G2P1011 s/p SVD.  Patient was admitted for IOL 2/2 gHTN.  She had a postpartum course that was uncomplicated including no problems with ambulating, PO intake, urination, pain, or bleeding. The patient feels ready to go home and will be discharged with outpatient follow-up.   Today: No acute events overnight.  Pt denies problems with ambulating, voiding or po intake.  She denies nausea or vomiting.  Pain is well controlled.  She has had flatus. She has had bowel movement.  Lochia Small.  Plan for birth control is Nexplanon.  Method of Feeding: Breast and bottle  Physical Exam:  Blood pressure 126/75, pulse 76, temperature 97.9 F (36.6 C), temperature source Oral, resp. rate 19, height 5\' 1"  (1.549 m), weight 172 lb (78.019 kg), last menstrual period 10/07/2013, SpO2 99 %, unknown if currently breastfeeding.  General: alert,  cooperative, appears stated age and no distress Lochia: appropriate Uterine Fundus: firm Ext: WWP, trace edema b/l LE, +2DP DVT Evaluation: No evidence of DVT seen on physical exam. Negative Homan's sign. No cords or calf tenderness.  H/H: Lab Results  Component Value Date/Time   HGB 10.7* 08/02/2014 09:05 PM   HCT 31.9* 08/02/2014 09:05 PM    Discharge Diagnoses: Term Pregnancy-delivered  Discharge Information: Date: 08/05/2014 Activity: pelvic rest Diet: routine  Medications: Ibuprofen Breast feeding:  Yes Condition: stable Instructions: refer to handout Discharge to: home      Medication List    TAKE these medications        acetaminophen 500 MG tablet  Commonly known as:  TYLENOL  Take 1,000 mg by mouth every 6 (six) hours as needed for headache.     CONCEPT DHA 53.5-38-1 MG Caps  Take 1 capsule by mouth daily.     ibuprofen 600 MG tablet  Commonly known as:  ADVIL,MOTRIN  Take 1 tablet (600 mg total) by mouth every 6 (six) hours.     ROLAIDS PO  Take 1-2 tablets by mouth 2 (two) times daily as needed (For heartburn.).           Follow-up Information    Follow up with [facility]. Schedule an appointment as soon as possible for a visit in 3 days.   Why:  blood pressure check   Contact information:   [address] [place] [employer] [place] [phone]      Raliegh IpGottschalk, Evelisse Szalkowski M, DO [facility], PGY-1 08/05/2014,8:23 AM

## 2014-08-05 NOTE — Lactation Note (Signed)
This note was copied from the chart of Anna Renaldo FiddlerJessica Zollars. Lactation Consultation Note  Mother has tender nipples, they are pink.   Provided comfort gels and discussed apply ebm. Encouraged mother to feed baby longer and at least every 3.5 hours. Attempted latching but baby is sleepy.  Discussed achieving a deep latch. Suggest she call for LC to view next feeding. Reviewed monitoring voids/stools and engorgement care.   Patient Name: Anna Hartman ZOXWR'UToday's Date: 08/05/2014 Reason for consult: Follow-up assessment   Maternal Data    Feeding Feeding Type: Breast Fed Length of feed: 9 min  LATCH Score/Interventions                      Lactation Tools Discussed/Used     Consult Status      Hardie PulleyBerkelhammer, Gerard Bonus Boschen 08/05/2014, 9:24 AM

## 2014-08-22 ENCOUNTER — Emergency Department (HOSPITAL_COMMUNITY)
Admission: EM | Admit: 2014-08-22 | Discharge: 2014-08-22 | Discharge status: Against Medical Advice | Payer: Medicaid Other

## 2014-08-22 NOTE — ED Notes (Signed)
Per registration pt left facility. 

## 2014-08-23 ENCOUNTER — Ambulatory Visit (INDEPENDENT_AMBULATORY_CARE_PROVIDER_SITE_OTHER): Payer: Medicaid Other | Admitting: Advanced Practice Midwife

## 2014-08-23 ENCOUNTER — Encounter: Payer: Self-pay | Admitting: Advanced Practice Midwife

## 2014-08-23 VITALS — BP 122/60 | HR 68 | Wt 152.0 lb

## 2014-08-23 DIAGNOSIS — K30 Functional dyspepsia: Secondary | ICD-10-CM

## 2014-08-23 DIAGNOSIS — R1013 Epigastric pain: Secondary | ICD-10-CM

## 2014-08-23 MED ORDER — OMEPRAZOLE 20 MG PO CPDR: 20.0000 mg | DELAYED_RELEASE_CAPSULE | Freq: Every day | ORAL | Status: DC

## 2014-08-23 NOTE — Progress Notes (Signed)
[facility] Visit  Patient name: Anna Hartman MRN [id number]  Date of birth: 09/21/1990  CC & HPI:  Anna Hartman is a 24 y.o. Caucasian female presenting today for C/O indigestion off and on for a few weeks.  IT got so bad last night she couldn't sleep.She occ has vomit "just come up"   She is 3 weeks pp following an uncomplicated SVD. She quit BF 5 days ago. States she saw orange dc wit slight "old" odor yesterday.   Pertinent History Reviewed:  Medical & Surgical Hx:   Past Medical History  Diagnosis Date  . Genital warts   . Vaginal discharge 03/08/2013  . Yeast infection 03/23/2013  . Contraceptive management 03/23/2013  . Miscarriage   . Positive pregnancy test 01/18/2014  . Pregnant 01/18/2014  . Urinary frequency 01/18/2014  . Missed periods 01/18/2014  . Supervision of normal pregnancy in first trimester 01/25/2014   Past Surgical History  Procedure Laterality Date  . Ankle fracture surgery     Family History  Problem Relation Age of Onset  . Cancer Paternal Grandmother   . COPD Maternal Grandmother   . Diabetes Father     Current outpatient prescriptions:  .  Calcium & Magnesium Carbonates (MYLANTA PO), Take by mouth., Disp: , Rfl:  .  acetaminophen (TYLENOL) 500 MG tablet, Take 1,000 mg by mouth every 6 (six) hours as needed for headache., Disp: , Rfl:  .  Ca Carbonate-Mag Hydroxide (ROLAIDS PO), Take 1-2 tablets by mouth 2 (two) times daily as needed (For heartburn.). , Disp: , Rfl:  .  ibuprofen (ADVIL,MOTRIN) 600 MG tablet, Take 1 tablet (600 mg total) by mouth every 6 (six) hours. (Patient not taking: Reported on 08/23/2014), Disp: 30 tablet, Rfl: 0 .  omeprazole (PRILOSEC) 20 MG capsule, Take 1 capsule (20 mg total) by mouth daily., Disp: 30 capsule, Rfl: 1 .  Prenat-FeFum-FePo-FA-Omega 3 (CONCEPT DHA) 53.5-38-1 MG CAPS, Take 1 capsule by mouth daily. (Patient not taking: Reported on 08/23/2014), Disp: 30 capsule, Rfl: 11 Social History: Reviewed -   reports that she has never smoked. She has never used smokeless tobacco.  Review of Systems:   Constitutional: Negative for fever and chills Eyes: Negative for visual disturbances Respiratory: Negative for shortness of breath, dyspnea Cardiovascular: Negative for chest pain or palpitations  Gastrointestinal: Negative for diarrhea and constipataion.  Has intermittent epigastric pain,midline Genitourinary: Negative for dysuria and urgency, vaginal irritation or itching Musculoskeletal: Negative for back pain, joint pain, myalgias  Neurological: Negative for dizziness and headaches    Objective Findings:  Vitals: BP 122/60 mmHg  Pulse 68  Wt 152 lb (68.947 kg)  Breastfeeding? No  Physical Examination: General appearance - alert, well appearing, and in no distress Mental status - alert, oriented to person, place, and time Chest - normal resp effort Heart - normal rate and regular rhythm Abdomen - soft, nontender, nondistended, no masses or organomegaly Pelvic - SSE:  Clear normal appeargin locia w/o odor    A/P:  Indigestion/reflux:  Rx prilosec 20mg  qd  Normal lochia  F/U 2 weeks for pp check up and 3 weeks for Nexplanon (ordered today)   CRESENZO-DISHMAN,Leotha Westermeyer CNM 08/23/2014 12:52 PM

## 2014-09-06 ENCOUNTER — Encounter: Payer: Self-pay | Admitting: Advanced Practice Midwife

## 2014-09-06 ENCOUNTER — Ambulatory Visit (INDEPENDENT_AMBULATORY_CARE_PROVIDER_SITE_OTHER): Payer: Medicaid Other | Admitting: Advanced Practice Midwife

## 2014-09-06 DIAGNOSIS — R1011 Right upper quadrant pain: Secondary | ICD-10-CM

## 2014-09-06 MED ORDER — OMEPRAZOLE 20 MG PO CPDR: 20.0000 mg | DELAYED_RELEASE_CAPSULE | Freq: Two times a day (BID) | ORAL | Status: DC

## 2014-09-06 MED ORDER — IBUPROFEN 600 MG PO TABS: 600.0000 mg | ORAL_TABLET | Freq: Four times a day (QID) | ORAL | Status: DC | PRN

## 2014-09-06 MED ORDER — IBUPROFEN 600 MG PO TABS: 600.0000 mg | ORAL_TABLET | Freq: Four times a day (QID) | ORAL | Status: DC

## 2014-09-06 NOTE — Addendum Note (Signed)
Addended by: Jacklyn ShellRESENZO-DISHMON, Katreena Schupp on: 09/06/2014 02:59 PM   Modules accepted: Orders

## 2014-09-06 NOTE — Progress Notes (Addendum)
  Anna Hartman is a 24 y.o. who presents for a postpartum visit. She is 5 weeks postpartum following a spontaneous vaginal delivery. I have fully reviewed the prenatal and intrapartum course. The delivery was at 40.0  gestational weeks.  Anesthesia: epidural. Postpartum course has been complicated by severe indigestion/reflux.  She started prilosec a few weeks ago with decemt results. .However, she is still having RUQ pain that radiates to her back, worse at night. Avoiding fatty foods.  Baby's course has been uneventful. Baby is feeding by bottle. Bleeding: no bleeding. Bowel function is normal. Bladder function is normal. Patient is not sexually active. Contraception method is none. Postpartum depression screening: negative.   Current outpatient prescriptions:  .  Calcium & Magnesium Carbonates (MYLANTA PO), Take by mouth., Disp: , Rfl:  .  ibuprofen (ADVIL,MOTRIN) 600 MG tablet, Take 1 tablet (600 mg total) by mouth every 6 (six) hours., Disp: 30 tablet, Rfl: 0 .  omeprazole (PRILOSEC) 20 MG capsule, Take 1 capsule (20 mg total) by mouth daily., Disp: 30 capsule, Rfl: 1  Review of Systems   Constitutional: Negative for fever and chills Eyes: Negative for visual disturbances Respiratory: Negative for shortness of breath, dyspnea Cardiovascular: Negative for chest pain or palpitations  Gastrointestinal: Negative for vomiting, diarrhea and constipation Genitourinary: Negative for dysuria and urgency.  Occ bleeding from hemorrnoid. Musculoskeletal: Negative for back pain, joint pain, myalgias  Neurological: Negative for dizziness and headaches   Objective:     Filed Vitals:   09/06/14 1047  BP: 110/76   General:  alert, cooperative and no distress   Breasts:  negative  Lungs: clear to auscultation bilaterally  Heart:  regular rate and rhythm  Abdomen: Soft, nontender   Vulva:  normal  Vagina: normal vagina  Cervix:  closed  Corpus: Well involuted     Rectal Exam: tiny  hemorrhoid, getting smaller        Assessment:    normla postpartum exam.  Plan:    1. Contraception: Nexplanon 2. Follow up in: 1 week for Nexplanon.  Continue witch hazel pads 3.  GB US ordered 4. Continue prilosec 20 mg BID

## 2014-09-13 ENCOUNTER — Ambulatory Visit (INDEPENDENT_AMBULATORY_CARE_PROVIDER_SITE_OTHER): Payer: Medicaid Other | Admitting: Advanced Practice Midwife

## 2014-09-13 ENCOUNTER — Encounter: Payer: Self-pay | Admitting: Advanced Practice Midwife

## 2014-09-13 VITALS — BP 110/70 | HR 72 | Wt 154.4 lb

## 2014-09-13 DIAGNOSIS — Z3202 Encounter for pregnancy test, result negative: Secondary | ICD-10-CM

## 2014-09-13 DIAGNOSIS — Z3049 Encounter for surveillance of other contraceptives: Secondary | ICD-10-CM

## 2014-09-13 DIAGNOSIS — R1011 Right upper quadrant pain: Secondary | ICD-10-CM

## 2014-09-13 DIAGNOSIS — Z30017 Encounter for initial prescription of implantable subdermal contraceptive: Secondary | ICD-10-CM | POA: Insufficient documentation

## 2014-09-13 LAB — POCT URINE PREGNANCY: PREG TEST UR: NEGATIVE

## 2014-09-13 NOTE — Progress Notes (Signed)
  HPI:  Anna Hartman is a 24 y.o. year old Caucasian female here for Nexplanon insertion. She has not had sex since the birth and her pregnancy test today was negative.  Risks/benefits/side effects of Nexplanon have been discussed and her questions have been answered.  Specifically, a failure rate of 04/998 has been reported, with an increased failure rate if pt takes St. John's Wort and/or antiseizure medicaitons.  Anna Hartman is aware of the common side effect of irregular bleeding, which the incidence of decreases over time.   Past Medical History: Past Medical History  Diagnosis Date  . Genital warts   . Vaginal discharge 03/08/2013  . Yeast infection 03/23/2013  . Contraceptive management 03/23/2013  . Miscarriage   . Positive pregnancy test 01/18/2014  . Pregnant 01/18/2014  . Urinary frequency 01/18/2014  . Missed periods 01/18/2014  . Supervision of normal pregnancy in first trimester 01/25/2014    Past Surgical History: Past Surgical History  Procedure Laterality Date  . Ankle fracture surgery      Family History: Family History  Problem Relation Age of Onset  . Cancer Paternal Grandmother   . COPD Maternal Grandmother   . Diabetes Father     Social History: History  Substance Use Topics  . Smoking status: Never Smoker   . Smokeless tobacco: Never Used  . Alcohol Use: No    Allergies: No Known Allergies    Her right arm, approximatly 4 inches proximal from the elbow, was cleansed with alcohol and anesthetized with 2cc of 2% Lidocaine.  The area was cleansed again and the Nexplanon was inserted without difficulty.  A pressure bandage was applied.  Pt was instructed to remove pressure bandage in a few hours, and keep insertion site covered with a bandaid for 3 days.  Back up contraception was recommended for 2 weeks.  Follow-up scheduled PRN problems   GB US scheduled for persistant RUQ/back pain CRESENZO-DISHMAN,Chiyoko Torrico 09/13/2014 11:29 AM

## 2014-09-19 ENCOUNTER — Ambulatory Visit (HOSPITAL_COMMUNITY): Admission: RE | Admit: 2014-09-19 | Payer: Medicaid Other | Source: Ambulatory Visit

## 2014-09-20 ENCOUNTER — Ambulatory Visit: Payer: Medicaid Other | Admitting: Advanced Practice Midwife

## 2014-11-13 ENCOUNTER — Other Ambulatory Visit: Payer: Self-pay | Admitting: Advanced Practice Midwife

## 2015-01-30 ENCOUNTER — Telehealth: Payer: Self-pay | Admitting: Advanced Practice Midwife

## 2015-01-30 ENCOUNTER — Other Ambulatory Visit: Payer: Self-pay | Admitting: *Deleted

## 2015-01-30 DIAGNOSIS — R1011 Right upper quadrant pain: Secondary | ICD-10-CM

## 2015-01-30 NOTE — Telephone Encounter (Signed)
Pt informed she can call and r/s gallbladder u/s at her convenience at [phone]. Pt verbalized understanding.

## 2015-02-02 ENCOUNTER — Ambulatory Visit (HOSPITAL_COMMUNITY): Payer: Medicaid Other

## 2015-02-02 ENCOUNTER — Telehealth: Payer: Self-pay | Admitting: *Deleted

## 2015-02-02 ENCOUNTER — Ambulatory Visit (HOSPITAL_COMMUNITY)
Admission: RE | Admit: 2015-02-02 | Discharge: 2015-02-02 | Disposition: A | Discharge status: Home / Self Care | Payer: Medicaid Other | Source: Ambulatory Visit | Attending: Advanced Practice Midwife | Admitting: Advanced Practice Midwife

## 2015-02-02 DIAGNOSIS — R1011 Right upper quadrant pain: Secondary | ICD-10-CM | POA: Diagnosis present

## 2015-02-02 DIAGNOSIS — K8020 Calculus of gallbladder without cholecystitis without obstruction: Secondary | ICD-10-CM | POA: Diagnosis not present

## 2015-02-02 NOTE — Telephone Encounter (Signed)
Pt requesting results of ultrasound from today 02/02/2015. Pt informed Cathie BeamsFran Cresenzo-Dishmon, CNM not in the office will route message for Drenda FreezeFran to contact pt once ultrasound reviewed by her. Pt verbalize understanding.

## 2015-02-04 NOTE — Telephone Encounter (Signed)
US shows multiple stones and sludge. Can you see if you can refer her to Dr. Lovell SheehanJenkins for a surgical consult?  She wants treatment for pain, nausea, and vomiting. Thanks, Drenda FreezeFran

## 2015-02-05 NOTE — Telephone Encounter (Signed)
Pt informed of multiple stones and Sludge and Ultrasound. Referral completed and faxed to Dr. Lovell SheehanJenkins. Pt will be notified of appt date and time with Dr. Lovell SheehanJenkins. Pt verbalized understanding.

## 2015-02-13 ENCOUNTER — Encounter (HOSPITAL_COMMUNITY): Payer: Self-pay

## 2015-02-13 ENCOUNTER — Encounter (HOSPITAL_COMMUNITY)
Admission: RE | Admit: 2015-02-13 | Discharge: 2015-02-13 | Disposition: A | Discharge status: Home / Self Care | Payer: Medicaid Other | Source: Ambulatory Visit | Attending: General Surgery | Admitting: General Surgery

## 2015-02-13 DIAGNOSIS — Z01818 Encounter for other preprocedural examination: Secondary | ICD-10-CM | POA: Insufficient documentation

## 2015-02-13 DIAGNOSIS — K8020 Calculus of gallbladder without cholecystitis without obstruction: Secondary | ICD-10-CM | POA: Diagnosis not present

## 2015-02-13 LAB — BASIC METABOLIC PANEL
Anion gap: 8 (ref 5–15)
BUN: 11 mg/dL (ref 6–20)
CHLORIDE: 110 mmol/L (ref 101–111)
CO2: 22 mmol/L (ref 22–32)
Calcium: 9.4 mg/dL (ref 8.9–10.3)
Creatinine, Ser: 0.71 mg/dL (ref 0.44–1.00)
GFR calc Af Amer: >60 mL/min (ref >60)
GFR calc non Af Amer: >60 mL/min (ref >60)
Glucose, Bld: 89 mg/dL (ref 65–99)
POTASSIUM: 3.7 mmol/L (ref 3.5–5.1)
SODIUM: 140 mmol/L (ref 135–145)

## 2015-02-13 LAB — CBC WITH DIFFERENTIAL/PLATELET
Basophils Absolute: 0 10*3/uL (ref 0.0–0.1)
Basophils Relative: 0 %
EOS PCT: 3 %
Eosinophils Absolute: 0.2 10*3/uL (ref 0.0–0.7)
HEMATOCRIT: 35.7 % — AB (ref 36.0–46.0)
HEMOGLOBIN: 11.5 g/dL — AB (ref 12.0–15.0)
LYMPHS ABS: 2.6 10*3/uL (ref 0.7–4.0)
LYMPHS PCT: 34 %
MCH: 26.4 pg (ref 26.0–34.0)
MCHC: 32.2 g/dL (ref 30.0–36.0)
MCV: 81.9 fL (ref 78.0–100.0)
Monocytes Absolute: 0.3 10*3/uL (ref 0.1–1.0)
Monocytes Relative: 4 %
NEUTROS ABS: 4.5 10*3/uL (ref 1.7–7.7)
NEUTROS PCT: 59 %
Platelets: 189 10*3/uL (ref 150–400)
RBC: 4.36 MIL/uL (ref 3.87–5.11)
RDW: 14.3 % (ref 11.5–15.5)
WBC: 7.6 10*3/uL (ref 4.0–10.5)

## 2015-02-13 LAB — HEPATIC FUNCTION PANEL
ALBUMIN: 4.2 g/dL (ref 3.5–5.0)
ALK PHOS: 78 U/L (ref 38–126)
ALT: 9 U/L — ABNORMAL LOW (ref 14–54)
AST: 20 U/L (ref 15–41)
Bilirubin, Direct: 0.1 mg/dL (ref 0.1–0.5)
Indirect Bilirubin: 0.6 mg/dL (ref 0.3–0.9)
TOTAL PROTEIN: 7.4 g/dL (ref 6.5–8.1)
Total Bilirubin: 0.7 mg/dL (ref 0.3–1.2)

## 2015-02-13 LAB — HCG, SERUM, QUALITATIVE: Preg, Serum: NEGATIVE

## 2015-02-13 NOTE — Patient Instructions (Signed)
Anna Hartman  02/13/2015     @PREFPERIOPPHARMACY @   Your procedure is scheduled on 02/19/2015.  Report to [facility] at 7:30 A.M.  Call this number if you have problems the morning of surgery:  [phone]   Remember:  Do not eat food or drink liquids after midnight.  Take these medicines the morning of surgery with A SIP OF WATER - May take Prilosec   Do not wear jewelry, make-up or nail polish.  Do not wear lotions, powders, or perfumes.  You may wear deodorant.  Do not shave 48 hours prior to surgery.  Men may shave face and neck.  Do not bring valuables to the hospital.  [facility] is not responsible for any belongings or valuables.  Contacts, dentures or bridgework may not be worn into surgery.  Leave your suitcase in the car.  After surgery it may be brought to your room.  For patients admitted to the hospital, discharge time will be determined by your treatment team.  Patients discharged the day of surgery will not be allowed to drive home.    Please read over the following fact sheets that you were given. Surgical Site Infection Prevention and Anesthesia Post-op Instructions     PATIENT INSTRUCTIONS POST-ANESTHESIA  IMMEDIATELY FOLLOWING SURGERY:  Do not drive or operate machinery for the first twenty four hours after surgery.  Do not make any important decisions for twenty four hours after surgery or while taking narcotic pain medications or sedatives.  If you develop intractable nausea and vomiting or a severe headache please notify your doctor immediately.  FOLLOW-UP:  Please make an appointment with your surgeon as instructed. You do not need to follow up with anesthesia unless specifically instructed to do so.  WOUND CARE INSTRUCTIONS (if applicable):  Keep a dry clean dressing on the anesthesia/puncture wound site if there is drainage.  Once the wound has quit draining you may leave it open to air.  Generally you should leave the bandage intact for  twenty four hours unless there is drainage.  If the epidural site drains for more than 36-48 hours please call the anesthesia department.  QUESTIONS?:  Please feel free to call your physician or the hospital operator if you have any questions, and they will be happy to assist you.      Laparoscopic Cholecystectomy Laparoscopic cholecystectomy is surgery to remove the gallbladder. The gallbladder is located in the upper right part of the abdomen, behind the liver. It is a storage sac for bile, which is produced in the liver. Bile aids in the digestion and absorption of fats. Cholecystectomy is often done for inflammation of the gallbladder (cholecystitis). This condition is usually caused by a buildup of gallstones (cholelithiasis) in the gallbladder. Gallstones can block the flow of bile, and that can result in inflammation and pain. In severe cases, emergency surgery may be required. If emergency surgery is not required, you will have time to prepare for the procedure. Laparoscopic surgery is an alternative to open surgery. Laparoscopic surgery has a shorter recovery time. Your common bile duct may also need to be examined during the procedure. If stones are found in the common bile duct, they may be removed. LET [facility] KNOW ABOUT:  Any allergies you have.  All medicines you are taking, including vitamins, herbs, eye drops, creams, and over-the-counter medicines.  Previous problems you or members of your family have had with the use of anesthetics.  Any blood disorders you have.  Previous surgeries you have had.  Any medical conditions you have. RISKS AND COMPLICATIONS Generally, this is a safe procedure. However, problems may occur, including:  Infection.  Bleeding.  Allergic reactions to medicines.  Damage to other structures or organs.  A stone remaining in the common bile duct.  A bile leak from the cyst duct that is clipped when your gallbladder is  removed.  The need to convert to open surgery, which requires a larger incision in the abdomen. This may be necessary if your surgeon thinks that it is not safe to continue with a laparoscopic procedure. BEFORE THE PROCEDURE  Ask your health care provider about:  Changing or stopping your regular medicines. This is especially important if you are taking diabetes medicines or blood thinners.  Taking medicines such as aspirin and ibuprofen. These medicines can thin your blood. Do not take these medicines before your procedure if your health care provider instructs you not to.  Follow instructions from your health care provider about eating or drinking restrictions.  Let your health care provider know if you develop a cold or an infection before surgery.  Plan to have someone take you home after the procedure.  Ask your health care provider how your surgical site will be marked or identified.  You may be given antibiotic medicine to help prevent infection. PROCEDURE  To reduce your risk of infection:  Your health care team will wash or sanitize their hands.  Your skin will be washed with soap.  An IV tube may be inserted into one of your veins.  You will be given a medicine to make you fall asleep (general anesthetic).  A breathing tube will be placed in your mouth.  The surgeon will make several small cuts (incisions) in your abdomen.  A thin, lighted tube (laparoscope) that has a tiny camera on the end will be inserted through one of the small incisions. The camera on the laparoscope will send a picture to a TV screen (monitor) in the operating room. This will give the surgeon a good view inside your abdomen.  A gas will be pumped into your abdomen. This will expand your abdomen to give the surgeon more room to perform the surgery.  Other tools that are needed for the procedure will be inserted through the other incisions. The gallbladder will be removed through one of the  incisions.  After your gallbladder has been removed, the incisions will be closed with stitches (sutures), staples, or skin glue.  Your incisions may be covered with a bandage (dressing). The procedure may vary among health care providers and hospitals. AFTER THE PROCEDURE  Your blood pressure, heart rate, breathing rate, and blood oxygen level will be monitored often until the medicines you were given have worn off.  You will be given medicines as needed to control your pain.   This information is not intended to replace advice given to you by your health care provider. Make sure you discuss any questions you have with your health care provider.   Document Released: 03/31/2005 Document Revised: 12/20/2014 Document Reviewed: 11/10/2012 Elsevier Interactive Patient Education [employer].

## 2015-02-14 ENCOUNTER — Inpatient Hospital Stay (HOSPITAL_COMMUNITY)
Admission: RE | Admit: 2015-02-14 | Discharge: 2015-02-14 | Disposition: A | Discharge status: Home / Self Care | Payer: Medicaid Other | Source: Ambulatory Visit

## 2015-02-14 NOTE — H&P (Signed)
  NTS SOAP Note  Vital Signs:  Vitals as of: 02/13/2015: Systolic 122: Diastolic 75: Heart Rate 64: Temp 97.81F: Height 655ft 1in: Weight 148Lbs 0 Ounces: Pain Level 4: BMI 27.96  BMI : 27.96 kg/m2  Subjective: This 24 year old female presents for of right upper quadrant abdominal pain.  Has been present during her pregnancy.  Has continued postpartum.  Pain radiates to right flank, nausea, and fatty food intolerance.  No fever, chills, jaundice.  U/S shows cholelithiasis, normal common bile duct.  Is not breast feeding.  Review of Symptoms:  Constitutional:unremarkable   Head:unremarkable Eyes:unremarkable   Nose/Mouth/Throat:unremarkable Cardiovascular:  unremarkable Respiratory:unremarkable Gastrointestinabdominal pain, nausea, heartburn Genitourinary:unremarkable   back pain Skin:unremarkable Hematolgic/Lymphatic:unremarkable   Allergic/Immunologic:unremarkable   Past Medical History:  Reviewed  Past Medical History  Surgical History: ankle surgery Allergies: nkda Medications: prilosec   Social History:Reviewed  Social History  Preferred Language: English Race:  White Ethnicity: Not Hispanic / Latino Age: [age] year Marital Status:  S Alcohol: no   Smoking Status: Never smoker reviewed on 02/13/2015 Functional Status reviewed on 02/13/2015 ------------------------------------------------ Bathing: Normal Cooking: Normal Dressing: Normal Driving: Normal Eating: Normal Managing Meds: Normal Oral Care: Normal Shopping: Normal Toileting: Normal Transferring: Normal Walking: Normal Cognitive Status reviewed on 02/13/2015 ------------------------------------------------ Attention: Normal Decision Making: Normal Language: Normal Memory: Normal Motor: Normal Perception: Normal Problem Solving: Normal Visual and Spatial: Normal   Family History:Reviewed  Family Health History Mother, Living; Healthy;  Father, Living; Diabetes mellitus,  unspecified type;     Objective Information: General:Well appearing, well nourished in no distress. Heart:RRR, no murmur or gallop.  Normal S1, S2.  No S3, S4.  Lungs:  CTA bilaterally, no wheezes, rhonchi, rales.  Breathing unlabored. Abdomen:Soft, slightly tender in right upper quadrant to palpation, ND, normal bowel sounds, no HSM, no masses.  No peritoneal signs.  Assessment:Cholelithiasis, cholecystitis  Diagnoses: 574.00  K80.00 Calculus of gallbladder with acute cholecystitis (Calculus of gallbladder with acute cholecystitis without obstruction)  Procedures: [id number] - OFFICE OUTPATIENT NEW 30 MINUTES    Plan:  Scheduled for laparoscopic cholecsytectomy on 02/19/15.   Patient Education:Alternative treatments to surgery were discussed with patient (and family).  Risks and benefits  of procedure including bleeding, infection, hepatobiliary injury, and the possibility of an open procedure were fully explained to the patient (and family) who gave informed consent. Patient/family questions were addressed.  Follow-up:Pending Surgery

## 2015-02-19 ENCOUNTER — Ambulatory Visit (HOSPITAL_COMMUNITY): Payer: Medicaid Other | Admitting: Anesthesiology

## 2015-02-19 ENCOUNTER — Encounter (HOSPITAL_COMMUNITY): Payer: Self-pay | Admitting: *Deleted

## 2015-02-19 ENCOUNTER — Ambulatory Visit (HOSPITAL_COMMUNITY)
Admission: RE | Admit: 2015-02-19 | Discharge: 2015-02-19 | Disposition: A | Discharge status: Home / Self Care | Payer: Medicaid Other | Source: Ambulatory Visit | Attending: General Surgery | Admitting: General Surgery

## 2015-02-19 ENCOUNTER — Encounter (HOSPITAL_COMMUNITY)
Admission: RE | Disposition: A | Discharge status: Home / Self Care | Payer: Self-pay | Source: Ambulatory Visit | Attending: General Surgery

## 2015-02-19 DIAGNOSIS — K8010 Calculus of gallbladder with chronic cholecystitis without obstruction: Secondary | ICD-10-CM | POA: Diagnosis present

## 2015-02-19 HISTORY — PX: CHOLECYSTECTOMY: SHX55

## 2015-02-19 SURGERY — LAPAROSCOPIC CHOLECYSTECTOMY
Anesthesia: General | Site: Abdomen

## 2015-02-19 MED ORDER — PROMETHAZINE HCL 25 MG/ML IJ SOLN
6.2500 mg | Freq: Once | INTRAMUSCULAR | Status: AC
  Administered 2015-02-19: 6.25 mg via INTRAVENOUS

## 2015-02-19 MED ORDER — PROPOFOL 10 MG/ML IV BOLUS
INTRAVENOUS | Status: DC | PRN
  Administered 2015-02-19: 130 mg via INTRAVENOUS

## 2015-02-19 MED ORDER — FENTANYL CITRATE (PF) 250 MCG/5ML IJ SOLN
INTRAMUSCULAR | Status: DC | PRN
  Administered 2015-02-19 (×4): 50 ug via INTRAVENOUS

## 2015-02-19 MED ORDER — GLYCOPYRROLATE 0.2 MG/ML IJ SOLN
INTRAMUSCULAR | Status: AC
  Filled 2015-02-19: qty 1

## 2015-02-19 MED ORDER — ROCURONIUM BROMIDE 50 MG/5ML IV SOLN
INTRAVENOUS | Status: AC
  Filled 2015-02-19: qty 1

## 2015-02-19 MED ORDER — BUPIVACAINE HCL (PF) 0.5 % IJ SOLN
INTRAMUSCULAR | Status: DC | PRN
  Administered 2015-02-19: 10 mL

## 2015-02-19 MED ORDER — POVIDONE-IODINE 10 % EX OINT
TOPICAL_OINTMENT | CUTANEOUS | Status: AC
  Filled 2015-02-19: qty 1

## 2015-02-19 MED ORDER — ROCURONIUM BROMIDE 100 MG/10ML IV SOLN
INTRAVENOUS | Status: DC | PRN
  Administered 2015-02-19: 25 mg via INTRAVENOUS
  Administered 2015-02-19: 5 mg via INTRAVENOUS

## 2015-02-19 MED ORDER — FENTANYL CITRATE (PF) 100 MCG/2ML IJ SOLN: 25.0000 ug | INTRAMUSCULAR | Status: DC | PRN

## 2015-02-19 MED ORDER — GLYCOPYRROLATE 0.2 MG/ML IJ SOLN
INTRAMUSCULAR | Status: DC | PRN
  Administered 2015-02-19: .5 mg via INTRAVENOUS

## 2015-02-19 MED ORDER — NEOSTIGMINE METHYLSULFATE 10 MG/10ML IV SOLN
INTRAVENOUS | Status: AC
  Filled 2015-02-19: qty 1

## 2015-02-19 MED ORDER — NEOSTIGMINE METHYLSULFATE 10 MG/10ML IV SOLN
INTRAVENOUS | Status: DC | PRN
  Administered 2015-02-19: 3 mg via INTRAVENOUS

## 2015-02-19 MED ORDER — FENTANYL CITRATE (PF) 250 MCG/5ML IJ SOLN
INTRAMUSCULAR | Status: AC
  Filled 2015-02-19: qty 25

## 2015-02-19 MED ORDER — ONDANSETRON HCL 4 MG/2ML IJ SOLN
4.0000 mg | Freq: Once | INTRAMUSCULAR | Status: AC
  Administered 2015-02-19: 4 mg via INTRAVENOUS
  Filled 2015-02-19: qty 2

## 2015-02-19 MED ORDER — LACTATED RINGERS IV SOLN
INTRAVENOUS | Status: DC
  Administered 2015-02-19 (×2): via INTRAVENOUS

## 2015-02-19 MED ORDER — PROMETHAZINE HCL 25 MG/ML IJ SOLN
INTRAMUSCULAR | Status: AC
  Filled 2015-02-19: qty 1

## 2015-02-19 MED ORDER — DEXAMETHASONE SODIUM PHOSPHATE 4 MG/ML IJ SOLN
4.0000 mg | Freq: Once | INTRAMUSCULAR | Status: AC
  Administered 2015-02-19: 4 mg via INTRAVENOUS
  Filled 2015-02-19: qty 1

## 2015-02-19 MED ORDER — OXYCODONE-ACETAMINOPHEN 7.5-325 MG PO TABS: 1.0000 | ORAL_TABLET | ORAL | Status: DC | PRN

## 2015-02-19 MED ORDER — ONDANSETRON HCL 4 MG/2ML IJ SOLN
4.0000 mg | Freq: Once | INTRAMUSCULAR | Status: DC | PRN
  Filled 2015-02-19: qty 2

## 2015-02-19 MED ORDER — LIDOCAINE HCL (PF) 1 % IJ SOLN
INTRAMUSCULAR | Status: AC
  Filled 2015-02-19: qty 5

## 2015-02-19 MED ORDER — CHLORHEXIDINE GLUCONATE 4 % EX LIQD: 1.0000 "application " | Freq: Once | CUTANEOUS | Status: DC

## 2015-02-19 MED ORDER — GLYCOPYRROLATE 0.2 MG/ML IJ SOLN
INTRAMUSCULAR | Status: AC
  Filled 2015-02-19: qty 5

## 2015-02-19 MED ORDER — FENTANYL CITRATE (PF) 100 MCG/2ML IJ SOLN
INTRAMUSCULAR | Status: AC
  Filled 2015-02-19: qty 2

## 2015-02-19 MED ORDER — LIDOCAINE HCL (CARDIAC) 20 MG/ML IV SOLN
INTRAVENOUS | Status: DC | PRN
  Administered 2015-02-19: 30 mg via INTRAVENOUS

## 2015-02-19 MED ORDER — CIPROFLOXACIN IN D5W 400 MG/200ML IV SOLN
400.0000 mg | INTRAVENOUS | Status: AC
  Administered 2015-02-19: 400 mg via INTRAVENOUS
  Filled 2015-02-19: qty 200

## 2015-02-19 MED ORDER — KETOROLAC TROMETHAMINE 30 MG/ML IJ SOLN
INTRAMUSCULAR | Status: AC
  Filled 2015-02-19: qty 1

## 2015-02-19 MED ORDER — POVIDONE-IODINE 10 % OINT PACKET
TOPICAL_OINTMENT | CUTANEOUS | Status: DC | PRN
  Administered 2015-02-19: 1 via TOPICAL

## 2015-02-19 MED ORDER — BUPIVACAINE HCL (PF) 0.5 % IJ SOLN
INTRAMUSCULAR | Status: AC
  Filled 2015-02-19: qty 30

## 2015-02-19 MED ORDER — HEMOSTATIC AGENTS (NO CHARGE) OPTIME
TOPICAL | Status: DC | PRN
  Administered 2015-02-19: 1 via TOPICAL

## 2015-02-19 MED ORDER — MIDAZOLAM HCL 2 MG/2ML IJ SOLN
1.0000 mg | INTRAMUSCULAR | Status: DC | PRN
Start: 2015-02-19 — End: 2015-02-19
  Administered 2015-02-19 (×2): 2 mg via INTRAVENOUS
  Filled 2015-02-19 (×2): qty 2

## 2015-02-19 MED ORDER — SODIUM CHLORIDE 0.9 % IR SOLN
Status: DC | PRN
  Administered 2015-02-19: 1000 mL

## 2015-02-19 MED ORDER — SODIUM CHLORIDE 0.9 % IJ SOLN
INTRAMUSCULAR | Status: AC
  Filled 2015-02-19: qty 3

## 2015-02-19 MED ORDER — KETOROLAC TROMETHAMINE 30 MG/ML IJ SOLN
30.0000 mg | Freq: Once | INTRAMUSCULAR | Status: AC
  Administered 2015-02-19: 30 mg via INTRAVENOUS

## 2015-02-19 SURGICAL SUPPLY — 46 items
APPLIER CLIP LAPSCP 10X32 DD (CLIP) ×3 IMPLANT
BAG HAMPER (MISCELLANEOUS) ×3 IMPLANT
BAG SPEC RTRVL LRG 6X4 10 (ENDOMECHANICALS) ×1
CHLORAPREP W/TINT 26ML (MISCELLANEOUS) ×3 IMPLANT
CLOTH BEACON ORANGE TIMEOUT ST (SAFETY) ×3 IMPLANT
COVER LIGHT HANDLE STERIS (MISCELLANEOUS) ×6 IMPLANT
DECANTER SPIKE VIAL GLASS SM (MISCELLANEOUS) ×3 IMPLANT
ELECT REM PT RETURN 9FT ADLT (ELECTROSURGICAL) ×3
ELECTRODE REM PT RTRN 9FT ADLT (ELECTROSURGICAL) ×1 IMPLANT
FILTER SMOKE EVAC LAPAROSHD (FILTER) ×3 IMPLANT
FORMALIN 10 PREFIL 120ML (MISCELLANEOUS) ×3 IMPLANT
GLOVE BIOGEL PI IND STRL 7.0 (GLOVE) IMPLANT
GLOVE BIOGEL PI INDICATOR 7.0 (GLOVE) ×4
GLOVE ECLIPSE 6.5 STRL STRAW (GLOVE) ×2 IMPLANT
GLOVE EXAM NITRILE MD LF STRL (GLOVE) ×2 IMPLANT
GLOVE SS BIOGEL STRL SZ 6.5 (GLOVE) IMPLANT
GLOVE SUPERSENSE BIOGEL SZ 6.5 (GLOVE) ×2
GLOVE SURG SS PI 7.5 STRL IVOR (GLOVE) ×3 IMPLANT
GOWN STRL REUS W/ TWL XL LVL3 (GOWN DISPOSABLE) ×1 IMPLANT
GOWN STRL REUS W/TWL LRG LVL3 (GOWN DISPOSABLE) ×6 IMPLANT
GOWN STRL REUS W/TWL XL LVL3 (GOWN DISPOSABLE) ×3
HEMOSTAT SNOW SURGICEL 2X4 (HEMOSTASIS) ×3 IMPLANT
INST SET LAPROSCOPIC AP (KITS) ×3 IMPLANT
IV NS IRRIG 3000ML ARTHROMATIC (IV SOLUTION) IMPLANT
KIT ROOM TURNOVER APOR (KITS) ×3 IMPLANT
MANIFOLD NEPTUNE II (INSTRUMENTS) ×3 IMPLANT
NDL INSUFFLATION 14GA 120MM (NEEDLE) ×1 IMPLANT
NEEDLE INSUFFLATION 14GA 120MM (NEEDLE) ×3 IMPLANT
NS IRRIG 1000ML POUR BTL (IV SOLUTION) ×3 IMPLANT
PACK LAP CHOLE LZT030E (CUSTOM PROCEDURE TRAY) ×3 IMPLANT
PAD ARMBOARD 7.5X6 YLW CONV (MISCELLANEOUS) ×3 IMPLANT
POUCH SPECIMEN RETRIEVAL 10MM (ENDOMECHANICALS) ×3 IMPLANT
SET BASIN LINEN APH (SET/KITS/TRAYS/PACK) ×3 IMPLANT
SET TUBE IRRIG SUCTION NO TIP (IRRIGATION / IRRIGATOR) IMPLANT
SLEEVE ENDOPATH XCEL 5M (ENDOMECHANICALS) ×3 IMPLANT
SPONGE GAUZE 2X2 8PLY STER LF (GAUZE/BANDAGES/DRESSINGS) ×4
SPONGE GAUZE 2X2 8PLY STRL LF (GAUZE/BANDAGES/DRESSINGS) ×8 IMPLANT
STAPLER VISISTAT (STAPLE) ×3 IMPLANT
SUT VICRYL 0 UR6 27IN ABS (SUTURE) ×3 IMPLANT
TAPE CLOTH SURG 4X10 WHT LF (GAUZE/BANDAGES/DRESSINGS) ×2 IMPLANT
TROCAR ENDO BLADELESS 11MM (ENDOMECHANICALS) ×3 IMPLANT
TROCAR XCEL NON-BLD 5MMX100MML (ENDOMECHANICALS) ×3 IMPLANT
TROCAR XCEL UNIV SLVE 11M 100M (ENDOMECHANICALS) ×3 IMPLANT
TUBING INSUFFLATION (TUBING) ×3 IMPLANT
WARMER LAPAROSCOPE (MISCELLANEOUS) ×3 IMPLANT
YANKAUER SUCT 12FT TUBE ARGYLE (SUCTIONS) ×3 IMPLANT

## 2015-02-19 NOTE — Anesthesia Postprocedure Evaluation (Signed)
  Anesthesia Post-op Note  Patient: Anna Hartman  Procedure(s) Performed: Procedure(s): LAPAROSCOPIC CHOLECYSTECTOMY (N/A)  Patient Location: PACU and Short Stay  Anesthesia Type:General  Level of Consciousness: awake, alert  and oriented  Airway and Oxygen Therapy: Patient Spontanous Breathing  Post-op Pain: mild  Post-op Assessment: Post-op Vital signs reviewed, Patient's Cardiovascular Status Stable, Respiratory Function Stable, Patent Airway and No signs of Nausea or vomiting              Post-op Vital Signs: Reviewed and stable  Last Vitals:  Filed Vitals:   02/19/15 1107  BP: 126/83  Pulse: 58  Temp: 36.4 C  Resp: 16    Complications: No apparent anesthesia complications

## 2015-02-19 NOTE — Transfer of Care (Signed)
Immediate Anesthesia Transfer of Care Note  Patient: Anna Hartman  Procedure(s) Performed: Procedure(s): LAPAROSCOPIC CHOLECYSTECTOMY (N/A)  Patient Location: PACU  Anesthesia Type:General  Level of Consciousness: awake and alert   Airway & Oxygen Therapy: Patient Spontanous Breathing and Patient connected to face mask oxygen  Post-op Assessment: Report given to RN  Post vital signs: Reviewed and stable  Last Vitals:  Filed Vitals:   02/19/15 0900  BP: 118/79  Resp: 14    Complications: No apparent anesthesia complications

## 2015-02-19 NOTE — Anesthesia Procedure Notes (Signed)
Procedure Name: Intubation Date/Time: 02/19/2015 9:11 AM Performed by: Glynn OctaveANIEL, Sael Furches E Pre-anesthesia Checklist: Patient identified, Patient being monitored, Timeout performed, Emergency Drugs available and Suction available Patient Re-evaluated:Patient Re-evaluated prior to inductionOxygen Delivery Method: Circle System Utilized Preoxygenation: Pre-oxygenation with 100% oxygen Intubation Type: IV induction Ventilation: Mask ventilation without difficulty Laryngoscope Size: Mac and 3 Grade View: Grade I Tube type: Oral Tube size: 7.0 mm Number of attempts: 1 Airway Equipment and Method: Stylet Placement Confirmation: ETT inserted through vocal cords under direct vision,  positive ETCO2 and breath sounds checked- equal and bilateral Secured at: 21 cm Tube secured with: Tape Dental Injury: Teeth and Oropharynx as per pre-operative assessment

## 2015-02-19 NOTE — Interval H&P Note (Signed)
History and Physical Interval Note:  02/19/2015 8:19 AM  Anna Hartman  has presented today for surgery, with the diagnosis of cholelithiasis  The various methods of treatment have been discussed with the patient and family. After consideration of risks, benefits and other options for treatment, the patient has consented to  Procedure(s): LAPAROSCOPIC CHOLECYSTECTOMY (N/A) as a surgical intervention .  The patient's history has been reviewed, patient examined, no change in status, stable for surgery.  I have reviewed the patient's chart and labs.  Questions were answered to the patient's satisfaction.     Franky MachoJENKINS,Chanda Laperle A

## 2015-02-19 NOTE — Discharge Instructions (Signed)
Laparoscopic Cholecystectomy, Care After °Refer to this sheet in the next few weeks. These instructions provide you with information about caring for yourself after your procedure. Your health care provider may also give you more specific instructions. Your treatment has been planned according to current medical practices, but problems sometimes occur. Call your health care provider if you have any problems or questions after your procedure. °WHAT TO EXPECT AFTER THE PROCEDURE °After your procedure, it is common to have: °· Pain at your incision sites. You will be given pain medicines to control your pain. °· Mild nausea or vomiting. This should improve after the first 24 hours. °· Bloating and possible shoulder pain from the gas that was used during the procedure. This will improve after the first 24 hours. °HOME CARE INSTRUCTIONS °Incision Care °· Follow instructions from your health care provider about how to take care of your incisions. Make sure you: °¨ Wash your hands with soap and water before you change your bandage (dressing). If soap and water are not available, use hand sanitizer. °¨ Change your dressing as told by your health care provider. °¨ Leave stitches (sutures), skin glue, or adhesive strips in place. These skin closures may need to be in place for 2 weeks or longer. If adhesive strip edges start to loosen and curl up, you may trim the loose edges. Do not remove adhesive strips completely unless your health care provider tells you to do that. °· Do not take baths, swim, or use a hot tub until your health care provider approves. Ask your health care provider if you can take showers. You may only be allowed to take sponge baths for bathing. °General Instructions °· Take over-the-counter and prescription medicines only as told by your health care provider. °· Do not drive or operate heavy machinery while taking prescription pain medicine. °· Return to your normal diet as told by your health care  provider. °· Do not lift anything that is heavier than 10 lb (4.5 kg). °· Do not play contact sports for one week or until your health care provider approves. °SEEK MEDICAL CARE IF:  °· You have redness, swelling, or pain at the site of your incision. °· You have fluid, blood, or pus coming from your incision. °· You notice a bad smell coming from your incision area. °· Your surgical incisions break open. °· You have a fever. °SEEK IMMEDIATE MEDICAL CARE IF: °· You develop a rash. °· You have difficulty breathing. °· You have chest pain. °· You have increasing pain in your shoulders (shoulder strap areas). °· You faint or have dizzy episodes while you are standing. °· You have severe pain in your abdomen. °· You have nausea or vomiting that lasts for more than one day. °  °This information is not intended to replace advice given to you by your health care provider. Make sure you discuss any questions you have with your health care provider. °  °Document Released: 03/31/2005 Document Revised: 12/20/2014 Document Reviewed: 11/10/2012 °Elsevier Interactive Patient Education ©2016 Elsevier Inc[employer] ° °

## 2015-02-19 NOTE — Anesthesia Preprocedure Evaluation (Signed)
Anesthesia Evaluation  Patient identified by MRN, date of birth, ID band Patient awake    Reviewed: Allergy & Precautions, NPO status , Patient's Chart, lab work & pertinent test results  Airway Mallampati: II  TM Distance: >3 FB     Dental  (+) Teeth Intact, Dental Advisory Given   Pulmonary neg pulmonary ROS,    breath sounds clear to auscultation       Cardiovascular negative cardio ROS   Rhythm:Regular Rate:Normal     Neuro/Psych    GI/Hepatic negative GI ROS,   Endo/Other    Renal/GU      Musculoskeletal   Abdominal   Peds  Hematology   Anesthesia Other Findings   Reproductive/Obstetrics                             Anesthesia Physical Anesthesia Plan  ASA: I  Anesthesia Plan: General   Post-op Pain Management:    Induction: Intravenous  Airway Management Planned: Oral ETT  Additional Equipment:   Intra-op Plan:   Post-operative Plan: Extubation in OR  Informed Consent: I have reviewed the patients History and Physical, chart, labs and discussed the procedure including the risks, benefits and alternatives for the proposed anesthesia with the patient or authorized representative who has indicated his/her understanding and acceptance.     Plan Discussed with:   Anesthesia Plan Comments:         Anesthesia Quick Evaluation

## 2015-02-19 NOTE — Op Note (Signed)
Patient:  Anna Hartman  DOB:  05/31/1990  MRN:  [id number]   Preop Diagnosis:  Cholecystitis, cholelithiasis  Postop Diagnosis:  Same  Procedure:  Laparoscopic cholecystectomy  Surgeon:  Franky MachoMark Gabbriella Presswood, M.D.  Anes:  Gen. endotracheal  Indications:  Patient is a 24 year old white female who presents with cholecystitis secondary to cholelithiasis. The risks and benefits of the procedure including bleeding, infection, hepatobiliary injury, and the possibility of an open procedure were fully explained to the patient, who gave informed consent.  Procedure note:  The patient was placed the supine position. After induction of general endotracheal anesthesia, the abdomen was prepped and draped using the usual sterile technique with DuraPrep. Surgical site confirmation was performed.  A supraumbilical incision was made down to the fascia. A Veress needle was introduced into the abdominal cavity and confirmation of placement was done using the saline drop test. The abdomen was then insufflated to 16 mmHg pressure. An 11 mm trocar was introduced into the abdominal cavity under direct visualization without difficulty. The patient was placed in reverse Trendelenburg position and an additional 11 mm trocar was placed the epigastric region and 5 mm trochars were placed the right upper quadrant and right flank regions. Liver was inspected and noted to be within normal limits. The gallbladder was retracted in a dynamic fashion in order to provide a critical view of the triangle of Calot. The cystic duct was first identified. Its junction to the infundibulum was fully identified. Endoclips were placed proximally and distally on the cystic duct, and the cystic duct was divided. This was likewise done to the cystic artery. The gallbladder was freed away from the gallbladder fossa using Bovie electrocautery. The gallbladder was delivered to the epigastric trocar site using an Endo Catch bag. The gallbladder fossa  was inspected no abnormal bleeding or bile leakage was noted. Surgicel was placed in the gallbladder fossa. All fluid and air were then evacuated from the abdominal cavity prior to removal of the trochars.  All wounds were irrigated with normal saline. All wounds were injected with 0.5% Sensorcaine. The supraumbilical fascia was reapproximated using 0 Vicryl interrupted suture. All skin incisions were closed using staples. Betadine ointment and dry sterile dressings were applied.  All tape and needle counts were correct at the end of the procedure. Patient was extubated in the operating room and transferred to PACU in stable condition.  Complications:  None  EBL:  Minimal  Specimen:  Gallbladder

## 2015-02-20 ENCOUNTER — Encounter (HOSPITAL_COMMUNITY): Payer: Self-pay | Admitting: General Surgery

## 2015-07-18 ENCOUNTER — Other Ambulatory Visit: Payer: Medicaid Other | Admitting: Advanced Practice Midwife

## 2015-07-23 ENCOUNTER — Other Ambulatory Visit: Payer: Self-pay | Admitting: Adult Health

## 2015-07-23 ENCOUNTER — Encounter: Payer: Self-pay | Admitting: Adult Health

## 2015-08-13 ENCOUNTER — Ambulatory Visit (INDEPENDENT_AMBULATORY_CARE_PROVIDER_SITE_OTHER): Payer: Medicaid Other | Admitting: Obstetrics & Gynecology

## 2015-08-13 ENCOUNTER — Other Ambulatory Visit (HOSPITAL_COMMUNITY)
Admission: RE | Admit: 2015-08-13 | Discharge: 2015-08-13 | Disposition: A | Discharge status: Home / Self Care | Payer: Medicaid Other | Source: Ambulatory Visit | Attending: Obstetrics & Gynecology | Admitting: Obstetrics & Gynecology

## 2015-08-13 VITALS — BP 90/60 | HR 72 | Ht 61.0 in | Wt 158.0 lb

## 2015-08-13 DIAGNOSIS — Z0000 Encounter for general adult medical examination without abnormal findings: Secondary | ICD-10-CM

## 2015-08-13 DIAGNOSIS — Z01419 Encounter for gynecological examination (general) (routine) without abnormal findings: Secondary | ICD-10-CM | POA: Diagnosis not present

## 2015-08-13 NOTE — Progress Notes (Signed)
Patient ID: Anna Hartman, female   DOB: Jun 20, 1990, 25 y.o.   MRN: [id number] Subjective:     Anna Hartman is a 25 y.o. female here for a routine exam.  Patient's last menstrual period was 06/29/2015. [id number] Birth Control Method:  nexplanon Menstrual Calendar(currently): none since 6 weeks  Current complaints: was having spotting on the nexplanon.   Current acute medical issues:  none   Recent Gynecologic History Patient's last menstrual period was 06/29/2015. Last Pap: 2015,  normal Last mammogram: ,    Past Medical History  Diagnosis Date  . Genital warts   . Vaginal discharge 03/08/2013  . Yeast infection 03/23/2013  . Contraceptive management 03/23/2013  . Miscarriage   . Positive pregnancy test 01/18/2014  . Pregnant 01/18/2014  . Urinary frequency 01/18/2014  . Missed periods 01/18/2014  . Supervision of normal pregnancy in first trimester 01/25/2014    Past Surgical History  Procedure Laterality Date  . Ankle fracture surgery    . Cholecystectomy N/A 02/19/2015    Procedure: LAPAROSCOPIC CHOLECYSTECTOMY;  Surgeon: Franky Macho, MD;  Location: [facility];  Service: General;  Laterality: N/A;    OB History    Gravida Para Term Preterm AB TAB SAB Ectopic Multiple Living   0 1      Social History   Social History  . Marital Status: Single    Spouse Name: N/A  . Number of Children: N/A  . Years of Education: N/A   Social History Main Topics  . Smoking status: Never Smoker   . Smokeless tobacco: Never Used  . Alcohol Use: No  . Drug Use: No  . Sexual Activity: Not Currently    Birth Control/ Protection: None   Other Topics Concern  . Not on file   Social History Narrative    Family History  Problem Relation Age of Onset  . Cancer Paternal Grandmother   . COPD Maternal Grandmother   . Diabetes Father      Current outpatient prescriptions:  .  ibuprofen (ADVIL,MOTRIN) 600 MG tablet, Take 1 tablet (600 mg total) by mouth every 6 (six)  hours as needed. (Patient not taking: Reported on 08/13/2015), Disp: 300 tablet, Rfl: 1  Review of Systems  Review of Systems  Constitutional: Negative for fever, chills, weight loss, malaise/fatigue and diaphoresis.  HENT: Negative for hearing loss, ear pain, nosebleeds, congestion, sore throat, neck pain, tinnitus and ear discharge.   Eyes: Negative for blurred vision, double vision, photophobia, pain, discharge and redness.  Respiratory: Negative for cough, hemoptysis, sputum production, shortness of breath, wheezing and stridor.   Cardiovascular: Negative for chest pain, palpitations, orthopnea, claudication, leg swelling and PND.  Gastrointestinal: negative for abdominal pain. Negative for heartburn, nausea, vomiting, diarrhea, constipation, blood in stool and melena.  Genitourinary: Negative for dysuria, urgency, frequency, hematuria and flank pain.  Musculoskeletal: Negative for myalgias, back pain, joint pain and falls.  Skin: Negative for itching and rash.  Neurological: Negative for dizziness, tingling, tremors, sensory change, speech change, focal weakness, seizures, loss of consciousness, weakness and headaches.  Endo/Heme/Allergies: Negative for environmental allergies and polydipsia. Does not bruise/bleed easily.  Psychiatric/Behavioral: Negative for depression, suicidal ideas, hallucinations, memory loss and substance abuse. The patient is not nervous/anxious and does not have insomnia.        Objective:  Blood pressure 90/60, pulse 72, height  (1.549 m), weight 158 lb (71.668 kg), last menstrual period 06/29/2015, not currently breastfeeding.   Physical  Exam  Vitals reviewed. Constitutional: She is oriented to person, place, and time. She appears well-developed and well-nourished.  HENT:  Head: Normocephalic and atraumatic.        Right Ear: External ear normal.  Left Ear: External ear normal.  Nose: Nose normal.  Mouth/Throat: Oropharynx is clear and moist.  Eyes:  Conjunctivae and EOM are normal. Pupils are equal, round, and reactive to light. Right eye exhibits no discharge. Left eye exhibits no discharge. No scleral icterus.  Neck: Normal range of motion. Neck supple. No tracheal deviation present. No thyromegaly present.  Cardiovascular: Normal rate, regular rhythm, normal heart sounds and intact distal pulses.  Exam reveals no gallop and no friction rub.   No murmur heard. Respiratory: Effort normal and breath sounds normal. No respiratory distress. She has no wheezes. She has no rales. She exhibits no tenderness.  GI: Soft. Bowel sounds are normal. She exhibits no distension and no mass. There is no tenderness. There is no rebound and no guarding.  Genitourinary:  Breasts no masses skin changes or nipple changes bilaterally      Vulva is normal without lesions Vagina is pink moist without discharge Cervix normal in appearance and pap is done Uterus is normal size shape and contour Adnexa is negative with normal sized ovaries   Musculoskeletal: Normal range of motion. She exhibits no edema and no tenderness.  Neurological: She is alert and oriented to person, place, and time. She has normal reflexes. She displays normal reflexes. No cranial nerve deficit. She exhibits normal muscle tone. Coordination normal.  Skin: Skin is warm and dry. No rash noted. No erythema. No pallor.  Psychiatric: She has a normal mood and affect. Her behavior is normal. Judgment and thought content normal.       Medications Ordered at today's visit: No orders of the defined types were placed in this encounter.    Other orders placed at today's visit: No orders of the defined types were placed in this encounter.      Assessment:    Healthy female exam.    Plan:    Contraception: Nexplanon. Follow up in: 1 year.

## 2015-08-16 LAB — CYTOLOGY - PAP

## 2016-05-30 ENCOUNTER — Ambulatory Visit: Payer: Medicaid Other | Admitting: Family Medicine

## 2016-07-22 ENCOUNTER — Ambulatory Visit: Payer: Medicaid Other | Admitting: Advanced Practice Midwife

## 2016-07-23 ENCOUNTER — Ambulatory Visit (INDEPENDENT_AMBULATORY_CARE_PROVIDER_SITE_OTHER): Payer: Medicaid Other | Admitting: Advanced Practice Midwife

## 2016-07-23 ENCOUNTER — Encounter: Payer: Self-pay | Admitting: Advanced Practice Midwife

## 2016-07-23 VITALS — BP 126/80 | HR 70 | Wt 171.0 lb

## 2016-07-23 DIAGNOSIS — N921 Excessive and frequent menstruation with irregular cycle: Secondary | ICD-10-CM | POA: Diagnosis not present

## 2016-07-23 DIAGNOSIS — Z975 Presence of (intrauterine) contraceptive device: Principal | ICD-10-CM

## 2016-07-23 DIAGNOSIS — Z978 Presence of other specified devices: Secondary | ICD-10-CM | POA: Diagnosis not present

## 2016-07-23 NOTE — Progress Notes (Signed)
[facility] Visit  Patient name: Anna Hartman MRN [id number]  Date of birth: Dec 30, 1990  CC & HPI:  Anna Hartman is a 26 y.o.  female presenting today for BTB on nexplanon.  Husb and is in [place] until July.  Has been amenorrheic for 2 years, started having BTB around 6 months ago, off and on, dark brown/black/red. Trying to lose weight.    Pertinent History Reviewed:  Medical & Surgical Hx:   Past Medical History:  Diagnosis Date  . Contraceptive management 03/23/2013  . Genital warts   . Miscarriage   . Missed periods 01/18/2014  . Positive pregnancy test 01/18/2014  . Pregnant 01/18/2014  . Supervision of normal pregnancy in first trimester 01/25/2014  . Urinary frequency 01/18/2014  . Vaginal discharge 03/08/2013  . Yeast infection 03/23/2013   Past Surgical History:  Procedure Laterality Date  . ANKLE FRACTURE SURGERY    . CHOLECYSTECTOMY N/A 02/19/2015   Procedure: LAPAROSCOPIC CHOLECYSTECTOMY;  Surgeon: Franky Macho, MD;  Location: [facility];  Service: General;  Laterality: N/A;   Family History  Problem Relation Age of Onset  . Cancer Paternal Grandmother   . COPD Maternal Grandmother   . Diabetes Father     Current Outpatient Prescriptions:  .  ibuprofen (ADVIL,MOTRIN) 600 MG tablet, Take 1 tablet (600 mg total) by mouth every 6 (six) hours as needed., Disp: 300 tablet, Rfl: 1 Social History: Reviewed -  reports that she has never smoked. She has never used smokeless tobacco.  Review of Systems:   Constitutional: Negative for fever and chills Eyes: Negative for visual disturbances Respiratory: Negative for shortness of breath, dyspnea Cardiovascular: Negative for chest pain or palpitations  Gastrointestinal: Negative for vomiting, diarrhea and constipation; no abdominal pain Genitourinary: Negative for dysuria and urgency, vaginal irritation or itching Musculoskeletal: Negative for back pain, joint pain, myalgias  Neurological: Negative for  dizziness and headaches    Objective Findings:    Physical Examination: General appearance - well appearing, and in no distress Mental status - alert, oriented to person, place, and time Chest:  Normal respiratory effort Heart - normal rate and regular rhythm Abdomen:  Soft, nontender Musculoskeletal:  Normal range of motion without pain Extremities:  No edema  discussion (15 minutes) about options.  Pt will come back for NExplanon removal, leave it out for a month or so, try Mirena before July   No results found for this or any previous visit (from the past 24 hour(s)).    Assessment & Plan:  A:   BT B on Nexplano P:     Return for nexplanon removeal.  CRESENZO-DISHMAN,Roel Douthat CNM 07/23/2016 2:36 PM

## 2016-08-06 ENCOUNTER — Encounter: Payer: Self-pay | Admitting: Advanced Practice Midwife

## 2016-08-06 ENCOUNTER — Ambulatory Visit (INDEPENDENT_AMBULATORY_CARE_PROVIDER_SITE_OTHER): Payer: Medicaid Other | Admitting: Advanced Practice Midwife

## 2016-08-06 VITALS — BP 108/72 | HR 84 | Ht 61.0 in | Wt 173.0 lb

## 2016-08-06 DIAGNOSIS — Z3046 Encounter for surveillance of implantable subdermal contraceptive: Secondary | ICD-10-CM

## 2016-08-06 NOTE — Patient Instructions (Signed)

## 2016-08-06 NOTE — Progress Notes (Signed)
HPI:  Anna Hartman 26 y.o. here for Nexplanon removal.  Her future plans for birth control are paragard. Husbnd in [place] until July, so not having sex.  Past Medical History: Past Medical History:  Diagnosis Date  . Contraceptive management 03/23/2013  . Genital warts   . Miscarriage   . Missed periods 01/18/2014  . Positive pregnancy test 01/18/2014  . Pregnant 01/18/2014  . Supervision of normal pregnancy in first trimester 01/25/2014  . Urinary frequency 01/18/2014  . Vaginal discharge 03/08/2013  . Yeast infection 03/23/2013    Past Surgical History: Past Surgical History:  Procedure Laterality Date  . ANKLE FRACTURE SURGERY    . CHOLECYSTECTOMY N/A 02/19/2015   Procedure: LAPAROSCOPIC CHOLECYSTECTOMY;  Surgeon: Franky Macho, MD;  Location: [facility];  Service: General;  Laterality: N/A;    Family History: Family History  Problem Relation Age of Onset  . Cancer Paternal Grandmother   . COPD Maternal Grandmother   . Diabetes Father     Social History: Social History  Substance Use Topics  . Smoking status: Never Smoker  . Smokeless tobacco: Never Used  . Alcohol use No    Allergies: No Known Allergies  Meds:  (Not in a hospital admission)    Patient given informed consent for removal of her Nexplanon, time out was performed.  Signed copy in the chart.  Appropriate time out taken. Implanon site identified.  Area prepped in usual sterile fashon. One cc of 1% lidocaine was used to anesthetize the area at the distal end of the implant. A small stab incision was made right beside the implant on the distal portion.  The Nexplanon rod was grasped using hemostats and removed without difficulty.  There was less than 3 cc blood loss. There were no complications.  A small amount of antibiotic ointment and steri-strips were applied over the small incision.  A pressure bandage was applied to reduce any bruising.  The patient tolerated the procedure well and was given post  procedure instructions.

## 2016-08-07 ENCOUNTER — Encounter: Payer: Self-pay | Admitting: Advanced Practice Midwife

## 2016-08-07 MED ORDER — MISOPROSTOL 200 MCG PO TABS: 400.0000 ug | ORAL_TABLET | Freq: Once | ORAL | 1 refills | Status: DC

## 2016-08-07 NOTE — Addendum Note (Signed)
Addended by: Jacklyn Shell on: 08/07/2016 10:49 AM   Modules accepted: Orders

## 2016-08-13 ENCOUNTER — Other Ambulatory Visit: Payer: Medicaid Other | Admitting: Advanced Practice Midwife

## 2016-08-18 ENCOUNTER — Other Ambulatory Visit: Payer: Medicaid Other | Admitting: Adult Health

## 2016-08-26 ENCOUNTER — Ambulatory Visit: Payer: Medicaid Other | Admitting: Women's Health

## 2016-09-05 ENCOUNTER — Ambulatory Visit: Payer: Medicaid Other | Admitting: Women's Health

## 2016-09-16 ENCOUNTER — Ambulatory Visit: Payer: Medicaid Other | Admitting: Advanced Practice Midwife

## 2016-09-25 ENCOUNTER — Ambulatory Visit (INDEPENDENT_AMBULATORY_CARE_PROVIDER_SITE_OTHER): Payer: Medicaid Other | Admitting: Women's Health

## 2016-09-25 ENCOUNTER — Encounter: Payer: Self-pay | Admitting: Women's Health

## 2016-09-25 VITALS — BP 120/80 | HR 80 | Ht 62.0 in | Wt 172.3 lb

## 2016-09-25 DIAGNOSIS — Z30430 Encounter for insertion of intrauterine contraceptive device: Secondary | ICD-10-CM | POA: Diagnosis not present

## 2016-09-25 DIAGNOSIS — Z3202 Encounter for pregnancy test, result negative: Secondary | ICD-10-CM | POA: Diagnosis not present

## 2016-09-25 LAB — POCT URINE PREGNANCY: Preg Test, Ur: NEGATIVE

## 2016-09-25 NOTE — Patient Instructions (Signed)
 Nothing in vagina for 3 days (no sex, douching, tampons, etc...)  Check your strings once a month to make sure you can feel them, if you are not able to please let us know  If you develop a fever of 100.4 or more in the next few weeks, or if you develop severe abdominal pain, please let us know  Use a backup method of birth control, such as condoms, for 2 weeks    Intrauterine Device Insertion, Care After This sheet gives you information about how to care for yourself after your procedure. Your health care provider may also give you more specific instructions. If you have problems or questions, contact your health care provider. What can I expect after the procedure? After the procedure, it is common to have:  Cramps and pain in the abdomen.  Light bleeding (spotting) or heavier bleeding that is like your menstrual period. This may last for up to a few days.  Lower back pain.  Dizziness.  Headaches.  Nausea.  Follow these instructions at home:  Before resuming sexual activity, check to make sure that you can feel the IUD string(s). You should be able to feel the end of the string(s) below the opening of your cervix. If your IUD string is in place, you may resume sexual activity. ? If you had a hormonal IUD inserted more than 7 days after your most recent period started, you will need to use a backup method of birth control for 7 days after IUD insertion. Ask your health care provider whether this applies to you.  Continue to check that the IUD is still in place by feeling for the string(s) after every menstrual period, or once a month.  Take over-the-counter and prescription medicines only as told by your health care provider.  Do not drive or use heavy machinery while taking prescription pain medicine.  Keep all follow-up visits as told by your health care provider. This is important. Contact a health care provider if:  You have bleeding that is heavier or lasts longer than  a normal menstrual cycle.  You have a fever.  You have cramps or abdominal pain that get worse or do not get better with medicine.  You develop abdominal pain that is new or is not in the same area of earlier cramping and pain.  You feel lightheaded or weak.  You have abnormal or bad-smelling discharge from your vagina.  You have pain during sexual activity.  You have any of the following problems with your IUD string(s): ? The string bothers or hurts you or your sexual partner. ? You cannot feel the string. ? The string has gotten longer.  You can feel the IUD in your vagina.  You think you may be pregnant, or you miss your menstrual period.  You think you may have an STI (sexually transmitted infection). Get help right away if:  You have flu-like symptoms.  You have a fever and chills.  You can feel that your IUD has slipped out of place. Summary  After the procedure, it is common to have cramps and pain in the abdomen. It is also common to have light bleeding (spotting) or heavier bleeding that is like your menstrual period.  Continue to check that the IUD is still in place by feeling for the string(s) after every menstrual period, or once a month.  Keep all follow-up visits as told by your health care provider. This is important.  Contact your health care provider if   you have problems with your IUD string(s), such as the string getting longer or bothering you or your sexual partner. This information is not intended to replace advice given to you by your health care provider. Make sure you discuss any questions you have with your health care provider. Document Released: 11/27/2010 Document Revised: 02/20/2016 Document Reviewed: 02/20/2016 Elsevier Interactive Patient Education  2017 Elsevier Inc[employer]  

## 2016-09-25 NOTE — Progress Notes (Signed)
Anna Hartman is a 26 y.o. year old [id number] Caucasian female who presents for placement of a Paragard IUD.  Patient's last menstrual period was 09/12/2016. BP 120/80   Pulse 80   Ht 5\' 2"  (1.575 m)   Wt 172 lb 4.8 oz (78.2 kg)   LMP 09/12/2016   BMI 31.51 kg/m  Last sexual intercourse was in April- husband is in [place], and pregnancy test today was neg.  The risks and benefits of the method and placement have been thouroughly reviewed with the patient and all questions were answered.  Specifically the patient is aware of failure rate of 04/998, expulsion of the IUD and of possible perforation.  The patient is aware of irregular bleeding due to the method and understands the incidence of irregular bleeding diminishes with time.  Signed copy of informed consent in chart.   Time out was performed.  A graves speculum was placed in the vagina.  The cervix was visualized, prepped using Betadine, and grasped with a single tooth tenaculum. The uterus was found to be retroflexed and it sounded to 7 cm.  Paragard IUD placed per manufacturer's recommendations.   The strings were trimmed to 3 cm.  Sonogram was performed and the proper placement of the IUD was verified via transvaginal u/s.   The patient was given post procedure instructions, including signs and symptoms of infection and to check for the strings after each menses or each month, and refraining from intercourse or anything in the vagina for 3 days.  She was given a Paragard care card with date IUD placed, and date IUD to be removed.  She is scheduled for a f/u appointment in 4 weeks for physical/IUD check.  Marge DuncansBooker, Kimberly Randall CNM, [facility]NP-BC 09/25/2016 12:39 PM

## 2016-10-21 ENCOUNTER — Telehealth: Payer: Self-pay | Admitting: Obstetrics & Gynecology

## 2016-10-21 NOTE — Telephone Encounter (Signed)
Patient has just changed birth control and is having a very heavy period. Patient would  like a refill of the medication she had been prescribed before to slow down her bleeding.

## 2016-10-21 NOTE — Telephone Encounter (Signed)
Informed patient that per Selena BattenKim, to give IUD more time, will discuss more at her f/u visit with Drenda FreezeFran. Verbalized understanding.

## 2016-10-21 NOTE — Telephone Encounter (Signed)
Patient called stating she is still having daily bleeding some days heavier than others for 2 weeks. Informed patient that with any new type of BC, it can take some time to get adjusted. Verbalized understanding but is requesting Megace. She was prescribed it before when she had her Nexplanon placed. Please advise.

## 2016-10-29 ENCOUNTER — Ambulatory Visit (INDEPENDENT_AMBULATORY_CARE_PROVIDER_SITE_OTHER): Payer: Medicaid Other | Admitting: Advanced Practice Midwife

## 2016-10-29 ENCOUNTER — Encounter: Payer: Self-pay | Admitting: Advanced Practice Midwife

## 2016-10-29 VITALS — BP 106/64 | HR 94 | Ht 61.0 in | Wt 171.0 lb

## 2016-10-29 DIAGNOSIS — N63 Unspecified lump in unspecified breast: Secondary | ICD-10-CM

## 2016-10-29 DIAGNOSIS — Z0000 Encounter for general adult medical examination without abnormal findings: Secondary | ICD-10-CM

## 2016-10-29 DIAGNOSIS — Z01411 Encounter for gynecological examination (general) (routine) with abnormal findings: Secondary | ICD-10-CM | POA: Diagnosis not present

## 2016-10-29 DIAGNOSIS — Z01419 Encounter for gynecological examination (general) (routine) without abnormal findings: Secondary | ICD-10-CM

## 2016-10-29 MED ORDER — MEGESTROL ACETATE 40 MG PO TABS: ORAL_TABLET | ORAL | 3 refills | Status: DC

## 2016-10-29 NOTE — Progress Notes (Signed)
Anna Hartman 26 y.o.  Vitals:   10/29/16 1105  BP: 106/64  Pulse: 94     Filed Weights   10/29/16 1105  Weight: 171 lb (77.6 kg)    Past Medical History: Past Medical History:  Diagnosis Date  . Contraceptive management 03/23/2013  . Genital warts   . Miscarriage   . Missed periods 01/18/2014  . Positive pregnancy test 01/18/2014  . Pregnant 01/18/2014  . Supervision of normal pregnancy in first trimester 01/25/2014  . Urinary frequency 01/18/2014  . Vaginal discharge 03/08/2013  . Yeast infection 03/23/2013    Past Surgical History: Past Surgical History:  Procedure Laterality Date  . ANKLE FRACTURE SURGERY    . CHOLECYSTECTOMY N/A 02/19/2015   Procedure: LAPAROSCOPIC CHOLECYSTECTOMY;  Surgeon: Franky MachoMark Jenkins, MD;  Location: [facility];  Service: General;  Laterality: N/A;    Family History: Family History  Problem Relation Age of Onset  . Cancer Paternal Grandmother   . COPD Maternal Grandmother   . Diabetes Father     Social History: Social History  Substance Use Topics  . Smoking status: Never Smoker  . Smokeless tobacco: Never Used  . Alcohol use No    Allergies: No Known Allergies   No current outpatient prescriptions on file.  History of Present Illness: here for exam doesn't need pap. Normal 5/17.   Paragard a few mo nths ago. Bleeds off and on.  Going to [place] to see FOB next week.  Doesn't want to be bleeding.   Review of Systems   Patient denies any headaches, blurred vision, shortness of breath, chest pain, abdominal pain, problems with bowel movements, urination, or intercourse.   Physical Exam: General:  Well developed, well nourished, no acute distress Skin:  Warm and dry.  Has some ? AKs  Seeing PCP next week, may get derm referral if necessary Neck:  Midline trachea, normal thyroid Lungs; Clear to auscultation bilaterally Breast:  1cm noduleright breast, tender, mobile Cardiovascular: Regular rate and rhythm Abdomen:  Soft, non  tender, no hepatosplenomegaly Pelvic:  External genitalia is normal in appearance.  The vagina is normal in appearance.  The cervix is bulbous.  Uterus is felt to be normal size, shape, and contour.  No adnexal masses or tenderness noted. IUD placement checked w/ bedside US.  Strings visible,  Discharge normal, cx non friable Extremities:  No swelling or varicosities noted Psych:  No mood changes.     Impression: norma GYN exam Breast noduel BTB   Plan: breast US 8/7 at 11:40 Megace this week and prn Pap 5/20

## 2016-10-29 NOTE — Patient Instructions (Signed)
August 7 at 11:[facility]

## 2016-11-18 ENCOUNTER — Ambulatory Visit (HOSPITAL_COMMUNITY): Payer: Medicaid Other | Attending: Advanced Practice Midwife

## 2016-12-17 ENCOUNTER — Other Ambulatory Visit (HOSPITAL_COMMUNITY): Payer: Self-pay | Admitting: Internal Medicine

## 2016-12-17 ENCOUNTER — Ambulatory Visit (HOSPITAL_COMMUNITY)
Admission: RE | Admit: 2016-12-17 | Discharge: 2016-12-17 | Disposition: A | Discharge status: Home / Self Care | Payer: Medicaid Other | Source: Ambulatory Visit | Attending: Internal Medicine | Admitting: Internal Medicine

## 2016-12-17 DIAGNOSIS — R52 Pain, unspecified: Secondary | ICD-10-CM

## 2016-12-17 DIAGNOSIS — M25512 Pain in left shoulder: Secondary | ICD-10-CM | POA: Insufficient documentation

## 2016-12-17 DIAGNOSIS — M25552 Pain in left hip: Secondary | ICD-10-CM | POA: Insufficient documentation

## 2017-08-05 ENCOUNTER — Ambulatory Visit: Payer: Medicaid Other | Admitting: Adult Health

## 2017-08-05 ENCOUNTER — Other Ambulatory Visit: Payer: Self-pay

## 2017-08-05 ENCOUNTER — Encounter: Payer: Self-pay | Admitting: Women's Health

## 2017-08-05 VITALS — BP 106/68 | HR 68 | Ht 61.0 in | Wt 154.0 lb

## 2017-08-05 DIAGNOSIS — N898 Other specified noninflammatory disorders of vagina: Secondary | ICD-10-CM

## 2017-08-05 DIAGNOSIS — N760 Acute vaginitis: Secondary | ICD-10-CM | POA: Diagnosis not present

## 2017-08-05 DIAGNOSIS — Z975 Presence of (intrauterine) contraceptive device: Secondary | ICD-10-CM | POA: Diagnosis not present

## 2017-08-05 DIAGNOSIS — B9689 Other specified bacterial agents as the cause of diseases classified elsewhere: Secondary | ICD-10-CM | POA: Diagnosis not present

## 2017-08-05 DIAGNOSIS — N939 Abnormal uterine and vaginal bleeding, unspecified: Secondary | ICD-10-CM | POA: Diagnosis not present

## 2017-08-05 LAB — POCT WET PREP (WET MOUNT)
CLUE CELLS WET PREP WHIFF POC: POSITIVE
WBC WET PREP: POSITIVE

## 2017-08-05 MED ORDER — METRONIDAZOLE 500 MG PO TABS: 500.0000 mg | ORAL_TABLET | Freq: Two times a day (BID) | ORAL | 0 refills | Status: DC

## 2017-08-05 NOTE — Progress Notes (Signed)
Subjective:     Patient ID: Anna Hartman, female   DOB: 06/12/1990, 27 y.o.   MRN: [id number]  HPI Anna Hartman is a 27 year old white female, G1P1 in complaining of bleeding for 20 days with IUD and +odor.   Review of Systems Bleeding for 20 days with IUD,has taken megace and is just light spotting now +odor Reviewed past medical,surgical, social and family history. Reviewed medications and allergies.     Objective:   Physical Exam BP 106/68 (BP Location: Right Arm, Patient Position: Sitting, Cuff Size: Normal)   Pulse 68   Ht 5\' 1"  (1.549 m)   Wt 154 lb (69.9 kg)   LMP 07/16/2017 (Approximate)   BMI 29.10 kg/m  Skin warm and dry.Pelvic: external genitalia is normal in appearance no lesions, vagina: white discharge with odor,urethra has no lesions or masses noted, cervix:smooth and bulbous,+IUD strings, cut strings to shorten, uterus: normal size, shape and contour, non tender, no masses felt, adnexa: no masses or tenderness noted. Bladder is non tender and no masses felt. Wet prep: + for clue cells and +WBCs. Nuswab obtained.     Assessment:     1. Abnormal uterine bleeding (AUB)   2. Vaginal discharge   3. BV (bacterial vaginosis)   4. IUD (intrauterine device) in place       Plan:     Nuswab sent Meds ordered this encounter  Medications  . metroNIDAZOLE (FLAGYL) 500 MG tablet    Sig: Take 1 tablet (500 mg total) by mouth 2 (two) times daily.    Dispense:  14 tablet    Refill:  0    Order Specific Question:   Supervising Provider    Answer:   Lazaro ArmsEURE, LUTHER H [[id number]]  Review handout on BV,no sex or alcohol while on flagyl  Return in 4 weeks for physical

## 2017-08-05 NOTE — Patient Instructions (Signed)
Bacterial Vaginosis Bacterial vaginosis is a vaginal infection that occurs when the normal balance of bacteria in the vagina is disrupted. It results from an overgrowth of certain bacteria. This is the most common vaginal infection among women ages 15-44. Because bacterial vaginosis increases your risk for STIs (sexually transmitted infections), getting treated can help reduce your risk for chlamydia, gonorrhea, herpes, and HIV (human immunodeficiency virus). Treatment is also important for preventing complications in pregnant women, because this condition can cause an early (premature) delivery. What are the causes? This condition is caused by an increase in harmful bacteria that are normally present in small amounts in the vagina. However, the reason that the condition develops is not fully understood. What increases the risk? The following factors may make you more likely to develop this condition:  Having a new sexual partner or multiple sexual partners.  Having unprotected sex.  Douching.  Having an intrauterine device (IUD).  Smoking.  Drug and alcohol abuse.  Taking certain antibiotic medicines.  Being pregnant.  You cannot get bacterial vaginosis from toilet seats, bedding, swimming pools, or contact with objects around you. What are the signs or symptoms? Symptoms of this condition include:  Grey or white vaginal discharge. The discharge can also be watery or foamy.  A fish-like odor with discharge, especially after sexual intercourse or during menstruation.  Itching in and around the vagina.  Burning or pain with urination.  Some women with bacterial vaginosis have no signs or symptoms. How is this diagnosed? This condition is diagnosed based on:  Your medical history.  A physical exam of the vagina.  Testing a sample of vaginal fluid under a microscope to look for a large amount of bad bacteria or abnormal cells. Your health care provider may use a cotton swab  or a small wooden spatula to collect the sample.  How is this treated? This condition is treated with antibiotics. These may be given as a pill, a vaginal cream, or a medicine that is put into the vagina (suppository). If the condition comes back after treatment, a second round of antibiotics may be needed. Follow these instructions at home: Medicines  Take over-the-counter and prescription medicines only as told by your health care provider.  Take or use your antibiotic as told by your health care provider. Do not stop taking or using the antibiotic even if you start to feel better. General instructions  If you have a female sexual partner, tell her that you have a vaginal infection. She should see her health care provider and be treated if she has symptoms. If you have a female sexual partner, he does not need treatment.  During treatment: ? Avoid sexual activity until you finish treatment. ? Do not douche. ? Avoid alcohol as directed by your health care provider. ? Avoid breastfeeding as directed by your health care provider.  Drink enough water and fluids to keep your urine clear or pale yellow.  Keep the area around your vagina and rectum clean. ? Wash the area daily with warm water. ? Wipe yourself from front to back after using the toilet.  Keep all follow-up visits as told by your health care provider. This is important. How is this prevented?  Do not douche.  Wash the outside of your vagina with warm water only.  Use protection when having sex. This includes latex condoms and dental dams.  Limit how many sexual partners you have. To help prevent bacterial vaginosis, it is best to have sex with just   one partner (monogamous).  Make sure you and your sexual partner are tested for STIs.  Wear cotton or cotton-lined underwear.  Avoid wearing tight pants and pantyhose, especially during summer.  Limit the amount of alcohol that you drink.  Do not use any products that  contain nicotine or tobacco, such as cigarettes and e-cigarettes. If you need help quitting, ask your health care provider.  Do not use illegal drugs. Where to find more information:  Centers for Disease Control and Prevention: www.cdc.gov/std  American Sexual Health Association (ASHA): www.ashastd.org  U.S. Department of Health and Human Services, Office on Women's Health: www.womenshealth.gov/ or https://www.womenshealth.gov/a-z-topics/bacterial-vaginosis Contact a health care provider if:  Your symptoms do not improve, even after treatment.  You have more discharge or pain when urinating.  You have a fever.  You have pain in your abdomen.  You have pain during sex.  You have vaginal bleeding between periods. Summary  Bacterial vaginosis is a vaginal infection that occurs when the normal balance of bacteria in the vagina is disrupted.  Because bacterial vaginosis increases your risk for STIs (sexually transmitted infections), getting treated can help reduce your risk for chlamydia, gonorrhea, herpes, and HIV (human immunodeficiency virus). Treatment is also important for preventing complications in pregnant women, because the condition can cause an early (premature) delivery.  This condition is treated with antibiotic medicines. These may be given as a pill, a vaginal cream, or a medicine that is put into the vagina (suppository). This information is not intended to replace advice given to you by your health care provider. Make sure you discuss any questions you have with your health care provider. Document Released: 03/31/2005 Document Revised: 08/04/2016 Document Reviewed: 12/15/2015 Elsevier Interactive Patient Education  2018 Elsevier Inc.  

## 2017-08-08 LAB — NUSWAB VAGINITIS PLUS (VG+)
Atopobium vaginae: HIGH Score — AB
BVAB 2: HIGH Score — AB
CANDIDA ALBICANS, NAA: NEGATIVE
CHLAMYDIA TRACHOMATIS, NAA: NEGATIVE
Candida glabrata, NAA: NEGATIVE
MEGASPHAERA 1: HIGH {score} — AB
Neisseria gonorrhoeae, NAA: NEGATIVE
TRICH VAG BY NAA: NEGATIVE

## 2018-02-08 ENCOUNTER — Telehealth: Payer: Self-pay | Admitting: Obstetrics & Gynecology

## 2018-02-08 MED ORDER — FLUCONAZOLE 150 MG PO TABS: ORAL_TABLET | ORAL | 1 refills | Status: DC

## 2018-02-08 NOTE — Telephone Encounter (Signed)
Patient states she has a yeast infection; itching, cottage cheese discharge.  Tried \\Monistat  over the counter this weekend with no relief.  She is requesting Diflucan.  Please advise.

## 2018-02-08 NOTE — Telephone Encounter (Signed)
Pt requests diflucan, will rx

## 2018-02-08 NOTE — Telephone Encounter (Signed)
Patient called, starting having a yeast infection yesterday.  She is requesting Diflucan.  [facility] [address]  [phone]

## 2018-02-12 ENCOUNTER — Telehealth: Payer: Self-pay | Admitting: Adult Health

## 2018-02-12 MED ORDER — NYSTATIN-TRIAMCINOLONE 100000-0.1 UNIT/GM-% EX CREA: 1.0000 "application " | TOPICAL_CREAM | Freq: Two times a day (BID) | CUTANEOUS | 0 refills | Status: DC

## 2018-02-12 NOTE — Telephone Encounter (Signed)
Patient called, stated you prescribed Diflucan Monday and her symptoms are a lot better, but she is still having some discomfort.  She would like to know what to do now.  [facility] [place]  [phone]

## 2018-02-12 NOTE — Telephone Encounter (Signed)
Still itching will rx mytrex

## 2018-04-26 ENCOUNTER — Ambulatory Visit: Payer: Medicaid Other | Admitting: Advanced Practice Midwife

## 2018-04-26 ENCOUNTER — Encounter: Payer: Self-pay | Admitting: Advanced Practice Midwife

## 2018-04-26 VITALS — BP 122/79 | HR 74 | Ht 61.0 in | Wt 169.0 lb

## 2018-04-26 DIAGNOSIS — N898 Other specified noninflammatory disorders of vagina: Secondary | ICD-10-CM | POA: Diagnosis not present

## 2018-04-26 DIAGNOSIS — Z30431 Encounter for routine checking of intrauterine contraceptive device: Secondary | ICD-10-CM

## 2018-04-26 NOTE — Progress Notes (Signed)
[facility] Visit  Patient name: Anna Hartman MRN [id number]  Date of birth: 22-May-1990  CC & HPI:  Anna Hartman is a 28 y.o. Caucasian female presenting today for IUD check. Got a Paragard 6/18.  Checked her strings for the first time, and feels a "hole" in cx. Feels like disharge is heavier than normal for a few weeks.  Also, has intermittent pain in left hip/front of pelvis. . May want it out to get pregnant  Pertinent History Reviewed:  Medical & Surgical Hx:   Past Medical History:  Diagnosis Date  . Contraceptive management 03/23/2013  . Genital warts   . Miscarriage   . Missed periods 01/18/2014  . Positive pregnancy test 01/18/2014  . Pregnant 01/18/2014  . Supervision of normal pregnancy in first trimester 01/25/2014  . Urinary frequency 01/18/2014  . Vaginal discharge 03/08/2013  . Yeast infection 03/23/2013   Past Surgical History:  Procedure Laterality Date  . ANKLE FRACTURE SURGERY    . CHOLECYSTECTOMY N/A 02/19/2015   Procedure: LAPAROSCOPIC CHOLECYSTECTOMY;  Surgeon: Franky Macho, MD;  Location: [facility];  Service: General;  Laterality: N/A;   Family History  Problem Relation Age of Onset  . Cancer Paternal Grandmother   . COPD Maternal Grandmother   . Diabetes Father     Current Outpatient Medications:  .  fluconazole (DIFLUCAN) 150 MG tablet, Take 1 now and repeat 1 in 3 days (Patient not taking: Reported on 04/26/2018), Disp: 2 tablet, Rfl: 1 .  megestrol (MEGACE) 40 MG tablet, 1-2/day PO prn bleeding (Patient not taking: Reported on 04/26/2018), Disp: 60 tablet, Rfl: 3 .  metroNIDAZOLE (FLAGYL) 500 MG tablet, Take 1 tablet (500 mg total) by mouth 2 (two) times daily. (Patient not taking: Reported on 04/26/2018), Disp: 14 tablet, Rfl: 0 .  nystatin-triamcinolone (MYCOLOG II) cream, Apply 1 application topically 2 (two) times daily. (Patient not taking: Reported on 04/26/2018), Disp: 30 g, Rfl: 0 Social History: Reviewed -  reports that she has never  smoked. She has never used smokeless tobacco.  Review of Systems:   Constitutional: Negative for fever and chills Eyes: Negative for visual disturbances Respiratory: Negative for shortness of breath, dyspnea Cardiovascular: Negative for chest pain or palpitations  Gastrointestinal: Negative for vomiting, diarrhea and constipation; no abdominal pain Genitourinary: Negative for dysuria and urgency, vaginal irritation or itching Musculoskeletal: Negative for back pain, joint pain, myalgias  Neurological: Negative for dizziness and headaches    Objective Findings:    Physical Examination: Vitals:   04/26/18 1056  BP: 122/79  Pulse: 74   General appearance - well appearing, and in no distress Mental status - alert, oriented to person, place, and time Chest:  Normal respiratory effort Heart - normal rate and regular rhythm Abdomen:  Soft, nontender Pelvic: SSE:  Small amount of clear/white dc.  No odor. Nuswab collected.  Bedside US shows properly placed IUD. Strings visible. "hole" pt is feeling is her cervical os.  Musculoskeletal:  Normal range of motion without pain Extremities:  No edema    No results found for this or any previous visit (from the past 24 hour(s)).    Assessment & Plan:  A:   IUD check, appears nl, but still possible that it is giving her pain/nerve stimulation P:  Take PNV 1 month before IUD removal    No follow-ups on file.  Jacklyn Shell CNM 04/26/2018 11:02 AM    '

## 2018-04-28 ENCOUNTER — Other Ambulatory Visit: Payer: Self-pay | Admitting: Advanced Practice Midwife

## 2018-04-28 LAB — NUSWAB VAGINITIS PLUS (VG+)
ATOPOBIUM VAGINAE: HIGH {score} — AB
BVAB 2: HIGH Score — AB
Candida albicans, NAA: NEGATIVE
Candida glabrata, NAA: NEGATIVE
Chlamydia trachomatis, NAA: NEGATIVE
Megasphaera 1: HIGH Score — AB
Neisseria gonorrhoeae, NAA: NEGATIVE
Trich vag by NAA: NEGATIVE

## 2018-04-28 MED ORDER — METRONIDAZOLE 500 MG PO TABS: 500.0000 mg | ORAL_TABLET | Freq: Two times a day (BID) | ORAL | 0 refills | Status: DC

## 2018-05-19 ENCOUNTER — Other Ambulatory Visit: Payer: Medicaid Other | Admitting: Adult Health

## 2018-05-24 ENCOUNTER — Other Ambulatory Visit: Payer: Self-pay | Admitting: Advanced Practice Midwife

## 2018-06-01 ENCOUNTER — Other Ambulatory Visit (HOSPITAL_COMMUNITY)
Admission: RE | Admit: 2018-06-01 | Discharge: 2018-06-01 | Disposition: A | Discharge status: Home / Self Care | Payer: Medicaid Other | Source: Ambulatory Visit | Attending: Adult Health | Admitting: Adult Health

## 2018-06-01 ENCOUNTER — Ambulatory Visit: Payer: Medicaid Other | Admitting: Women's Health

## 2018-06-01 ENCOUNTER — Encounter: Payer: Self-pay | Admitting: Women's Health

## 2018-06-01 VITALS — BP 110/73 | HR 75 | Ht 62.0 in | Wt 169.4 lb

## 2018-06-01 DIAGNOSIS — Z0000 Encounter for general adult medical examination without abnormal findings: Secondary | ICD-10-CM | POA: Diagnosis not present

## 2018-06-01 DIAGNOSIS — Z01419 Encounter for gynecological examination (general) (routine) without abnormal findings: Secondary | ICD-10-CM | POA: Diagnosis present

## 2018-06-01 NOTE — Progress Notes (Signed)
   WELL-WOMAN EXAMINATION Patient name: Anna Hartman MRN [id number]  Date of birth: 01/04/1991 Chief Complaint:   Gynecologic Exam (pap/physcial)  History of Present Illness:   Anna Hartman is a 28 y.o. G16P1011 Caucasian female being seen today for a routine well-woman exam.  Current complaints: may want IUD out for pregnancy, hasn't decided yet. Recently had yeast infection, then BV, then yeast again after taking metronidazole. Breast lump Rt breast for few months. Drinks 'a lot' of caffeine.  PCP: none      does not desire labs Patient's last menstrual period was 04/23/2018. The current method of family planning is Paragard IUD placed 09/25/16 Last pap 08/13/15. Results were: normal Last mammogram: never. Results were: n/a. Family h/o breast cancer: Yes, [facility] Last colonoscopy: never. Results were: n/a. Family h/o colorectal cancer: No Review of Systems:   Pertinent items are noted in HPI Denies any headaches, blurred vision, fatigue, shortness of breath, chest pain, abdominal pain, abnormal vaginal discharge/itching/odor/irritation, problems with periods, bowel movements, urination, or intercourse unless otherwise stated above. Pertinent History Reviewed:  Reviewed past medical,surgical, social and family history.  Reviewed problem list, medications and allergies. Physical Assessment:   Vitals:   06/01/18 1434  BP: 110/73  Pulse: 75  Weight: 169 lb 6.4 oz (76.8 kg)  Height: 5\' 2"  (1.575 m)  Body mass index is 30.98 kg/m.        Physical Examination:   General appearance - well appearing, and in no distress  Mental status - alert, oriented to person, place, and time  Psych:  She has a normal mood and affect  Skin - warm and dry, normal color, no suspicious lesions noted  Chest - effort normal, all lung fields clear to auscultation bilaterally  Heart - normal rate and regular rhythm  Neck:  midline trachea, no thyromegaly or nodules  Breasts - breasts appear normal,  no suspicious masses, no skin or nipple changes or  axillary nodes. NO lump felt, had pt feel as well, she was unable to feel it.  Abdomen - soft, nontender, nondistended, no masses or organomegaly  Pelvic - VULVA: normal appearing vulva with no masses, tenderness or lesions  VAGINA: normal appearing vagina with normal color and discharge, no lesions  CERVIX: normal appearing cervix without discharge or lesions, no CMT, IUD strings visible  Thin prep pap is done w/ HR HPV cotesting  UTERUS: uterus is felt to be normal size, shape, consistency and nontender   ADNEXA: No adnexal masses or tenderness noted.  Extremities:  No swelling or varicosities noted  No results found for this or any previous visit (from the past 24 hour(s)).  Assessment & Plan:  1) Well-Woman Exam  2) IUD in place> let us know when decides to have removed for pregnancy, PNV prior to removal  3) Resolved breast lump> could be r/t caffeine intake, let us know if returns/changes  4) Recurrent vaginal infections> none now, can try vaginal probiotics  Labs/procedures today: pap  Mammogram @30yo  or sooner if problems Colonoscopy @30yo  or sooner if problems  No orders of the defined types were placed in this encounter.   Follow-up: Return in about 1 year (around 06/02/2019) for Physical.  Cheral Marker CNM, [facility] 06/01/2018 3:22 PM

## 2018-06-01 NOTE — Addendum Note (Signed)
Addended by: Federico Flake A on: 06/01/2018 03:43 PM   Modules accepted: Orders

## 2018-06-04 LAB — CYTOLOGY - PAP
Diagnosis: NEGATIVE
HPV (WINDOPATH): NOT DETECTED

## 2018-06-10 ENCOUNTER — Telehealth: Payer: Self-pay | Admitting: Women's Health

## 2018-06-10 NOTE — Telephone Encounter (Signed)
Pt informed pap was normal.

## 2018-06-10 NOTE — Telephone Encounter (Signed)
Patient called stating that she would like a call back from the nurse regarding the results of her pap. Please contact pt °

## 2019-01-05 ENCOUNTER — Telehealth: Payer: Self-pay | Admitting: *Deleted

## 2019-01-05 NOTE — Telephone Encounter (Signed)
Pt called and left message stating that she needs Manus Gunning or Anderson Malta to send a prescription for her.

## 2019-01-05 NOTE — Telephone Encounter (Signed)
Called patient to see what medication she needs called in. Heard message that patient not available and that vm is full.

## 2019-01-06 NOTE — Telephone Encounter (Signed)
Mailbox full @ 10:43 am. [employer]

## 2019-01-06 NOTE — Telephone Encounter (Signed)
Returning patient's call. Mailbox full, unable to leave VM.

## 2019-02-09 ENCOUNTER — Encounter: Payer: Self-pay | Admitting: *Deleted

## 2019-02-09 ENCOUNTER — Telehealth: Payer: Self-pay | Admitting: *Deleted

## 2019-02-09 MED ORDER — MEGESTROL ACETATE 40 MG PO TABS: ORAL_TABLET | ORAL | 1 refills | Status: DC

## 2019-02-09 NOTE — Addendum Note (Signed)
Addended by: Wells Guiles R on: 02/09/2019 01:07 PM   Modules accepted: Orders

## 2019-02-09 NOTE — Telephone Encounter (Signed)
Pt called requesting refill for Megace.

## 2019-02-15 ENCOUNTER — Encounter: Payer: Self-pay | Admitting: Adult Health

## 2019-02-15 ENCOUNTER — Ambulatory Visit: Payer: Medicaid Other | Admitting: Adult Health

## 2019-02-15 ENCOUNTER — Other Ambulatory Visit: Payer: Self-pay

## 2019-02-15 VITALS — BP 126/84 | HR 72 | Ht 61.0 in | Wt 168.0 lb

## 2019-02-15 DIAGNOSIS — Z30431 Encounter for routine checking of intrauterine contraceptive device: Secondary | ICD-10-CM

## 2019-02-15 DIAGNOSIS — F419 Anxiety disorder, unspecified: Secondary | ICD-10-CM | POA: Diagnosis not present

## 2019-02-15 DIAGNOSIS — F329 Major depressive disorder, single episode, unspecified: Secondary | ICD-10-CM | POA: Diagnosis not present

## 2019-02-15 DIAGNOSIS — F411 Generalized anxiety disorder: Secondary | ICD-10-CM | POA: Insufficient documentation

## 2019-02-15 MED ORDER — PAROXETINE HCL 10 MG PO TABS: 10.0000 mg | ORAL_TABLET | Freq: Every day | ORAL | 2 refills | Status: DC

## 2019-02-15 MED ORDER — HYDROXYZINE HCL 10 MG PO TABS: ORAL_TABLET | ORAL | 1 refills | Status: DC

## 2019-02-15 NOTE — Progress Notes (Signed)
  Subjective:     Patient ID: Anna Hartman, female   DOB: 12-21-1990, 28 y.o.   MRN: [id number]  HPI Anna Hartman is a 28 year old white female, G2P1011, in for IUD check, she thought she felt it today and has had some itching, and feels anxious.  Review of Systems ?felt end of IUD Some itching at times Feels anxious No  New sex partners   Reviewed past medical,surgical, social and family history. Reviewed medications and allergies.     Objective:   Physical Exam BP 126/84 (BP Location: Left Arm, Patient Position: Sitting, Cuff Size: Normal)   Pulse 72   Ht 5\' 1"  (1.549 m)   Wt 168 lb (76.2 kg)   LMP 02/02/2019 (Exact Date)   BMI 31.74 kg/m   Skin warm and dry.Pelvic: external genitalia is normal in appearance no lesions, vagina: scant discharge without odor,urethra has no lesions or masses noted, cervix:smooth and bulbous,+UD strings at os. uterus: normal size, shape and contour, non tender, no masses felt, adnexa: no masses or tenderness noted. Bladder is non tender and no masses felt.   Fall risk is low PHQ 9 score is 5, denies suicidal ideation and is open to meds. Examination chaperoned by Rolena Infante LPN.   Assessment:     1. Anxiety and depression   2. IUD check up       Plan:     Will Rx Paxil and vistaril Meds ordered this encounter  Medications  . PARoxetine (PAXIL) 10 MG tablet    Sig: Take 1 tablet (10 mg total) by mouth daily.    Dispense:  30 tablet    Refill:  2    Order Specific Question:   Supervising Provider    Answer:   Elonda Husky, LUTHER H [[id number]]  . hydrOXYzine (ATARAX/VISTARIL) 10 MG tablet    Sig: Take 1 every 6-8 hours prn anxiety    Dispense:  30 tablet    Refill:  1    Order Specific Question:   Supervising Provider    Answer:   Tania Ade H [[id number]]   Try mediation and deep breathing Follow up in 6 weeks

## 2019-03-29 ENCOUNTER — Ambulatory Visit: Payer: Medicaid Other | Admitting: Adult Health

## 2019-08-10 ENCOUNTER — Other Ambulatory Visit: Payer: Medicaid Other

## 2019-08-17 ENCOUNTER — Other Ambulatory Visit (HOSPITAL_COMMUNITY)
Admission: RE | Admit: 2019-08-17 | Discharge: 2019-08-17 | Disposition: A | Discharge status: Home / Self Care | Payer: Medicaid Other | Source: Ambulatory Visit | Attending: Obstetrics & Gynecology | Admitting: Obstetrics & Gynecology

## 2019-08-17 ENCOUNTER — Other Ambulatory Visit: Payer: Self-pay

## 2019-08-17 ENCOUNTER — Other Ambulatory Visit (INDEPENDENT_AMBULATORY_CARE_PROVIDER_SITE_OTHER): Payer: Medicaid Other | Admitting: *Deleted

## 2019-08-17 DIAGNOSIS — N898 Other specified noninflammatory disorders of vagina: Secondary | ICD-10-CM

## 2019-08-17 DIAGNOSIS — Z113 Encounter for screening for infections with a predominantly sexual mode of transmission: Secondary | ICD-10-CM | POA: Insufficient documentation

## 2019-08-17 NOTE — Progress Notes (Signed)
   NURSE VISIT- VAGINITIS/STD/POC  SUBJECTIVE:  Anna Hartman is a 29 y.o. G2P1011 GYN patientfemale here for a vaginal swab for STD screen.  She reports the following symptoms: none for n/a none. Denies abnormal vaginal bleeding, significant pelvic pain, fever, or UTI symptoms.  OBJECTIVE:  There were no vitals taken for this visit.  Appears well, in no apparent distress  ASSESSMENT: Vaginal swab for STD screen  PLAN: Self-collected vaginal probe for Gonorrhea, Chlamydia, Trichomonas sent to lab Treatment: to be determined once results are received Follow-up as needed if symptoms persist/worsen, or new symptoms develop  Annamarie Dawley  08/17/2019 10:30 AM

## 2019-08-19 LAB — CERVICOVAGINAL ANCILLARY ONLY
Chlamydia: NEGATIVE
Comment: NEGATIVE
Comment: NEGATIVE
Comment: NORMAL
Neisseria Gonorrhea: NEGATIVE
Trichomonas: NEGATIVE

## 2019-08-25 ENCOUNTER — Encounter: Payer: Self-pay | Admitting: Women's Health

## 2019-08-25 ENCOUNTER — Ambulatory Visit (INDEPENDENT_AMBULATORY_CARE_PROVIDER_SITE_OTHER): Payer: Medicaid Other | Admitting: Women's Health

## 2019-08-25 VITALS — BP 109/73 | HR 77 | Ht 62.0 in | Wt 164.0 lb

## 2019-08-25 DIAGNOSIS — Z01419 Encounter for gynecological examination (general) (routine) without abnormal findings: Secondary | ICD-10-CM

## 2019-08-25 DIAGNOSIS — F329 Major depressive disorder, single episode, unspecified: Secondary | ICD-10-CM

## 2019-08-25 DIAGNOSIS — R197 Diarrhea, unspecified: Secondary | ICD-10-CM | POA: Diagnosis not present

## 2019-08-25 DIAGNOSIS — F419 Anxiety disorder, unspecified: Secondary | ICD-10-CM | POA: Diagnosis not present

## 2019-08-25 DIAGNOSIS — Z3009 Encounter for other general counseling and advice on contraception: Secondary | ICD-10-CM

## 2019-08-25 DIAGNOSIS — F418 Other specified anxiety disorders: Secondary | ICD-10-CM

## 2019-08-25 DIAGNOSIS — Z0000 Encounter for general adult medical examination without abnormal findings: Secondary | ICD-10-CM | POA: Diagnosis not present

## 2019-08-25 DIAGNOSIS — F32A Depression, unspecified: Secondary | ICD-10-CM

## 2019-08-25 MED ORDER — SERTRALINE HCL 25 MG PO TABS: 25.0000 mg | ORAL_TABLET | Freq: Every day | ORAL | 6 refills | Status: DC

## 2019-08-25 NOTE — Progress Notes (Signed)
WELL-WOMAN EXAMINATION Patient name: Anna Hartman MRN [id number]  Date of birth: 1990/09/19 Chief Complaint:   Gynecologic Exam  History of Present Illness:   Anna Hartman is a 29 y.o. G39P1011 Caucasian female being seen today for a routine well-woman exam.  Current complaints: dep/anx- paxil made her feel like a zombie, so stopped it. Wants to try something else. Denies SI/HI. Appetite up and down. Falls asleep quickly, wakes up a couple of times. Still finds joy in some things. Starts new job w/ [employer] this month, excited about that! Declines counseling.  Loose stools x 67yr. S/P cholecystectomy 2016. Hits at random times. Some abd pain.  Wants BTL, 527% certain she doesn't want anymore children. Found out partner cheated on her, got STD screen last week, neg.   Depression screen [facility] 2/9 08/25/2019 02/15/2019 10/29/2016  Decreased Interest 1 2 1   Down, Depressed, Hopeless 2 1 0  PHQ - 2 Score 3 3 1   Altered sleeping 0 0 -  Tired, decreased energy 1 1 -  Change in appetite 3 0 -  Feeling bad or failure about yourself  0 0 -  Trouble concentrating 0 - -  Moving slowly or fidgety/restless 0 1 -  Suicidal thoughts 0 0 -  PHQ-9 Score 7 5 -  Difficult doing work/chores - Somewhat difficult -     PCP: none      does not desire labs No LMP recorded. (Menstrual status: IUD). The current method of family planning is IUD. Paragard inserted 09/25/16 Last pap 06/01/18. Results were: normal. H/O abnormal pap: yes -long time ago Last mammogram: never. Results were: N/A. Family h/o breast cancer: yes 2 PAs-older Last colonoscopy: never. Results were: N/A. Family h/o colorectal cancer: no Review of Systems:   Pertinent items are noted in HPI Denies any headaches, blurred vision, fatigue, shortness of breath, chest pain, abdominal pain, abnormal vaginal discharge/itching/odor/irritation, problems with periods, bowel movements, urination, or intercourse unless otherwise stated  above. Pertinent History Reviewed:  Reviewed past medical,surgical, social and family history.  Reviewed problem list, medications and allergies. Physical Assessment:   Vitals:   08/25/19 0833  BP: 109/73  Pulse: 77  Weight: 164 lb (74.4 kg)  Height: 5\' 2"  (1.575 m)  Body mass index is 30 kg/m.        Physical Examination:   General appearance - well appearing, and in no distress  Mental status - alert, oriented to person, place, and time  Psych:  She has a normal mood and affect  Skin - warm and dry, normal color, no suspicious lesions noted  Chest - effort normal, all lung fields clear to auscultation bilaterally  Heart - normal rate and regular rhythm  Neck:  midline trachea, no thyromegaly or nodules  Breasts - breasts appear normal, no suspicious masses, no skin or nipple changes or  axillary nodes  Abdomen - soft, nontender, nondistended, no masses or organomegaly. Slight tenderness @ umbilicus, no hernia or abnormality noted. Does have scar from cholecystectomy there, ?adhesions  Pelvic - VULVA: normal appearing vulva with no masses, tenderness or lesions  VAGINA: normal appearing vagina with normal color and discharge, no lesions  CERVIX: normal appearing cervix without discharge or lesions, no CMT, IUD strings visible  Thin prep pap is not done   UTERUS: uterus is felt to be normal size, shape, consistency and nontender   ADNEXA: No adnexal masses or tenderness noted.  Extremities:  No swelling or varicosities noted  Chaperone: Diona Fanti  No results found for this or any previous visit (from the past 24 hour(s)).  Assessment & Plan:  1) Well-Woman Exam  2) Dep/anx> rx zoloft 25mg , understands can take few weeks to notice improvements. F/U 4wks. Declines counseling  3) Loose stools> x 46yr w/ abd pain, s/p cholecystectomy 2016. Try GI probiotic x 2017, if not improved make appt w/ GI  4) Wants BTL> schedule pre-op w/ MD  Labs/procedures today: none  Mammogram  @31yo  or sooner if problems Colonoscopy @31yo  or sooner if problems  No orders of the defined types were placed in this encounter.   Meds:  Meds ordered this encounter  Medications  . sertraline (ZOLOFT) 25 MG tablet    Sig: Take 1 tablet (25 mg total) by mouth daily.    Dispense:  30 tablet    Refill:  6    Order Specific Question:   Supervising Provider    Answer:   [[id number]]    Follow-up: Return in about 4 weeks (around 09/22/2019) for MD only BTL pre-op, f/u meds.  CNM, [facility] 08/25/2019 9:10 AM

## 2019-08-25 NOTE — Patient Instructions (Signed)
GI probiotic

## 2019-08-30 NOTE — Progress Notes (Signed)
Chart reviewed for nurse visit. Agree with plan of care.  Adline Potter, NP 08/30/2019 1:46 PM

## 2019-09-22 ENCOUNTER — Encounter: Payer: Medicaid Other | Admitting: Obstetrics & Gynecology

## 2019-11-16 ENCOUNTER — Telehealth: Payer: Self-pay | Admitting: Adult Health

## 2019-11-16 NOTE — Telephone Encounter (Signed)
Patient called stating that she has a UTI and would like a medication called in for this, pt states she can not come in and gets off of work @ 7:00 pm today. Pt states that she uses [facility] in [place]. Please contact pt

## 2019-11-16 NOTE — Telephone Encounter (Signed)
Telephoned patient at home number. Patient states thinks she has an UTI. Advised patient would need to schedule a nurse visit or go to PCP or Urgent care. Patient states will call back to schedule. Patient voiced understanding.

## 2019-11-17 ENCOUNTER — Other Ambulatory Visit: Payer: Medicaid Other

## 2020-06-07 ENCOUNTER — Telehealth: Payer: Medicaid Other | Admitting: Orthopedic Surgery

## 2020-06-07 DIAGNOSIS — N760 Acute vaginitis: Secondary | ICD-10-CM

## 2020-06-07 MED ORDER — FLUCONAZOLE 150 MG PO TABS: 150.0000 mg | ORAL_TABLET | Freq: Once | ORAL | 0 refills | Status: AC

## 2020-06-07 NOTE — Progress Notes (Signed)
We are sorry that you are not feeling well. Here is how we plan to help! Based on what you shared with me it looks like you: May have a yeast vaginosis  Vaginosis is an inflammation of the vagina that can result in discharge, itching and pain. The cause is usually a change in the normal balance of vaginal bacteria or an infection. Vaginosis can also result from reduced estrogen levels after menopause.  The most common causes of vaginosis are:   Bacterial vaginosis which results from an overgrowth of one on several organisms that are normally present in your vagina.   Yeast infections which are caused by a naturally occurring fungus called candida.   Vaginal atrophy (atrophic vaginosis) which results from the thinning of the vagina from reduced estrogen levels after menopause.   Trichomoniasis which is caused by a parasite and is commonly transmitted by sexual intercourse.  Factors that increase your risk of developing vaginosis include: . Medications, such as antibiotics and steroids . Uncontrolled diabetes . Use of hygiene products such as bubble bath, vaginal spray or vaginal deodorant . Douching . Wearing damp or tight-fitting clothing . Using an intrauterine device (IUD) for birth control . Hormonal changes, such as those associated with pregnancy, birth control pills or menopause . Sexual activity . Having a sexually transmitted infection  Your treatment plan is A single Diflucan (fluconazole) 150mg tablet once.  I have electronically sent this prescription into the pharmacy that you have chosen.  Be sure to take all of the medication as directed. Stop taking any medication if you develop a rash, tongue swelling or shortness of breath. Mothers who are breast feeding should consider pumping and discarding their breast milk while on these antibiotics. However, there is no consensus that infant exposure at these doses would be harmful.  Remember that medication creams can weaken latex  condoms. .   HOME CARE:  Good hygiene may prevent some types of vaginosis from recurring and may relieve some symptoms:  . Avoid baths, hot tubs and whirlpool spas. Rinse soap from your outer genital area after a shower, and dry the area well to prevent irritation. Don't use scented or harsh soaps, such as those with deodorant or antibacterial action. . Avoid irritants. These include scented tampons and pads. . Wipe from front to back after using the toilet. Doing so avoids spreading fecal bacteria to your vagina.  Other things that may help prevent vaginosis include:  . Don't douche. Your vagina doesn't require cleansing other than normal bathing. Repetitive douching disrupts the normal organisms that reside in the vagina and can actually increase your risk of vaginal infection. Douching won't clear up a vaginal infection. . Use a latex condom. Both female and female latex condoms may help you avoid infections spread by sexual contact. . Wear cotton underwear. Also wear pantyhose with a cotton crotch. If you feel comfortable without it, skip wearing underwear to bed. Yeast thrives in moist environments Your symptoms should improve in the next day or two.  GET HELP RIGHT AWAY IF:  . You have pain in your lower abdomen ( pelvic area or over your ovaries) . You develop nausea or vomiting . You develop a fever . Your discharge changes or worsens . You have persistent pain with intercourse . You develop shortness of breath, a rapid pulse, or you faint.  These symptoms could be signs of problems or infections that need to be evaluated by a medical provider now.  MAKE SURE YOU      Understand these instructions.  Will watch your condition.  Will get help right away if you are not doing well or get worse.  Your e-visit answers were reviewed by a board certified advanced clinical practitioner to complete your personal care plan. Depending upon the condition, your plan could have included  both over the counter or prescription medications. Please review your pharmacy choice to make sure that you have choses a pharmacy that is open for you to pick up any needed prescription, Your safety is important to us. If you have drug allergies check your prescription carefully.   You can use MyChart to ask questions about today's visit, request a non-urgent call back, or ask for a work or school excuse for 24 hours related to this e-Visit. If it has been greater than 24 hours you will need to follow up with your provider, or enter a new e-Visit to address those concerns. You will get a MyChart message within the next two days asking about your experience. I hope that your e-visit has been valuable and will speed your recovery.  Greater than 5 minutes, yet less than 10 minutes of time have been spent researching, coordinating and implementing care for this patient today.   

## 2020-06-15 ENCOUNTER — Telehealth: Payer: Medicaid Other | Admitting: Emergency Medicine

## 2020-06-15 DIAGNOSIS — N760 Acute vaginitis: Secondary | ICD-10-CM | POA: Diagnosis not present

## 2020-06-15 MED ORDER — METRONIDAZOLE 500 MG PO TABS: 500.0000 mg | ORAL_TABLET | Freq: Two times a day (BID) | ORAL | 0 refills | Status: DC

## 2020-06-15 NOTE — Progress Notes (Signed)

## 2020-07-21 ENCOUNTER — Encounter: Payer: Self-pay | Admitting: Physician Assistant

## 2020-07-21 ENCOUNTER — Telehealth: Payer: Medicaid Other | Admitting: Physician Assistant

## 2020-07-21 DIAGNOSIS — B373 Candidiasis of vulva and vagina: Secondary | ICD-10-CM

## 2020-07-21 DIAGNOSIS — B3731 Acute candidiasis of vulva and vagina: Secondary | ICD-10-CM

## 2020-07-21 MED ORDER — FLUCONAZOLE 150 MG PO TABS: 150.0000 mg | ORAL_TABLET | Freq: Once | ORAL | 0 refills | Status: AC

## 2020-07-21 NOTE — Progress Notes (Signed)
We are sorry that you are not feeling well. Here is how we plan to help! Based on what you shared with me it looks like you: May have a yeast vaginosis  Vaginosis is an inflammation of the vagina that can result in discharge, itching and pain. The cause is usually a change in the normal balance of vaginal bacteria or an infection. Vaginosis can also result from reduced estrogen levels after menopause.  The most common causes of vaginosis are:   Bacterial vaginosis which results from an overgrowth of one on several organisms that are normally present in your vagina.   Yeast infections which are caused by a naturally occurring fungus called candida.   Vaginal atrophy (atrophic vaginosis) which results from the thinning of the vagina from reduced estrogen levels after menopause.   Trichomoniasis which is caused by a parasite and is commonly transmitted by sexual intercourse.  Factors that increase your risk of developing vaginosis include: . Medications, such as antibiotics and steroids . Uncontrolled diabetes . Use of hygiene products such as bubble bath, vaginal spray or vaginal deodorant . Douching . Wearing damp or tight-fitting clothing . Using an intrauterine device (IUD) for birth control . Hormonal changes, such as those associated with pregnancy, birth control pills or menopause . Sexual activity . Having a sexually transmitted infection  Your treatment plan is A single Diflucan (fluconazole) 150mg tablet once.  I have electronically sent this prescription into the pharmacy that you have chosen.  Be sure to take all of the medication as directed. Stop taking any medication if you develop a rash, tongue swelling or shortness of breath. Mothers who are breast feeding should consider pumping and discarding their breast milk while on these antibiotics. However, there is no consensus that infant exposure at these doses would be harmful.  Remember that medication creams can weaken latex  condoms. .   HOME CARE:  Good hygiene may prevent some types of vaginosis from recurring and may relieve some symptoms:  . Avoid baths, hot tubs and whirlpool spas. Rinse soap from your outer genital area after a shower, and dry the area well to prevent irritation. Don't use scented or harsh soaps, such as those with deodorant or antibacterial action. . Avoid irritants. These include scented tampons and pads. . Wipe from front to back after using the toilet. Doing so avoids spreading fecal bacteria to your vagina.  Other things that may help prevent vaginosis include:  . Don't douche. Your vagina doesn't require cleansing other than normal bathing. Repetitive douching disrupts the normal organisms that reside in the vagina and can actually increase your risk of vaginal infection. Douching won't clear up a vaginal infection. . Use a latex condom. Both female and female latex condoms may help you avoid infections spread by sexual contact. . Wear cotton underwear. Also wear pantyhose with a cotton crotch. If you feel comfortable without it, skip wearing underwear to bed. Yeast thrives in moist environments Your symptoms should improve in the next day or two.  GET HELP RIGHT AWAY IF:  . You have pain in your lower abdomen ( pelvic area or over your ovaries) . You develop nausea or vomiting . You develop a fever . Your discharge changes or worsens . You have persistent pain with intercourse . You develop shortness of breath, a rapid pulse, or you faint.  These symptoms could be signs of problems or infections that need to be evaluated by a medical provider now.  MAKE SURE YOU      Understand these instructions.  Will watch your condition.  Will get help right away if you are not doing well or get worse.  Your e-visit answers were reviewed by a board certified advanced clinical practitioner to complete your personal care plan. Depending upon the condition, your plan could have included  both over the counter or prescription medications. Please review your pharmacy choice to make sure that you have choses a pharmacy that is open for you to pick up any needed prescription, Your safety is important to us. If you have drug allergies check your prescription carefully.   You can use MyChart to ask questions about today's visit, request a non-urgent call back, or ask for a work or school excuse for 24 hours related to this e-Visit. If it has been greater than 24 hours you will need to follow up with your provider, or enter a new e-Visit to address those concerns. You will get a MyChart message within the next two days asking about your experience. I hope that your e-visit has been valuable and will speed your recovery.  I spent 5-10 minutes on review and completion of this note- Salinda Snedeker PAC  

## 2020-07-28 ENCOUNTER — Telehealth: Payer: Medicaid Other | Admitting: Physician Assistant

## 2020-07-28 DIAGNOSIS — B379 Candidiasis, unspecified: Secondary | ICD-10-CM

## 2020-07-28 DIAGNOSIS — T3695XA Adverse effect of unspecified systemic antibiotic, initial encounter: Secondary | ICD-10-CM

## 2020-07-28 MED ORDER — FLUCONAZOLE 150 MG PO TABS: 150.0000 mg | ORAL_TABLET | Freq: Once | ORAL | 0 refills | Status: AC

## 2020-07-28 NOTE — Progress Notes (Signed)
We are sorry that you are not feeling well. Here is how we plan to help! Based on what you shared with me it looks like you: May have a yeast vaginosis  Vaginosis is an inflammation of the vagina that can result in discharge, itching and pain. The cause is usually a change in the normal balance of vaginal bacteria or an infection. Vaginosis can also result from reduced estrogen levels after menopause.  The most common causes of vaginosis are:   Bacterial vaginosis which results from an overgrowth of one on several organisms that are normally present in your vagina.   Yeast infections which are caused by a naturally occurring fungus called candida.   Vaginal atrophy (atrophic vaginosis) which results from the thinning of the vagina from reduced estrogen levels after menopause.   Trichomoniasis which is caused by a parasite and is commonly transmitted by sexual intercourse.  Factors that increase your risk of developing vaginosis include: . Medications, such as antibiotics and steroids . Uncontrolled diabetes . Use of hygiene products such as bubble bath, vaginal spray or vaginal deodorant . Douching . Wearing damp or tight-fitting clothing . Using an intrauterine device (IUD) for birth control . Hormonal changes, such as those associated with pregnancy, birth control pills or menopause . Sexual activity . Having a sexually transmitted infection  Your treatment plan is A single Diflucan (fluconazole) 150mg tablet once.  I have electronically sent this prescription into the pharmacy that you have chosen.  Be sure to take all of the medication as directed. Stop taking any medication if you develop a rash, tongue swelling or shortness of breath. Mothers who are breast feeding should consider pumping and discarding their breast milk while on these antibiotics. However, there is no consensus that infant exposure at these doses would be harmful.  Remember that medication creams can weaken latex  condoms. .   HOME CARE:  Good hygiene may prevent some types of vaginosis from recurring and may relieve some symptoms:  . Avoid baths, hot tubs and whirlpool spas. Rinse soap from your outer genital area after a shower, and dry the area well to prevent irritation. Don't use scented or harsh soaps, such as those with deodorant or antibacterial action. . Avoid irritants. These include scented tampons and pads. . Wipe from front to back after using the toilet. Doing so avoids spreading fecal bacteria to your vagina.  Other things that may help prevent vaginosis include:  . Don't douche. Your vagina doesn't require cleansing other than normal bathing. Repetitive douching disrupts the normal organisms that reside in the vagina and can actually increase your risk of vaginal infection. Douching won't clear up a vaginal infection. . Use a latex condom. Both female and female latex condoms may help you avoid infections spread by sexual contact. . Wear cotton underwear. Also wear pantyhose with a cotton crotch. If you feel comfortable without it, skip wearing underwear to bed. Yeast thrives in moist environments Your symptoms should improve in the next day or two.  GET HELP RIGHT AWAY IF:  . You have pain in your lower abdomen ( pelvic area or over your ovaries) . You develop nausea or vomiting . You develop a fever . Your discharge changes or worsens . You have persistent pain with intercourse . You develop shortness of breath, a rapid pulse, or you faint.  These symptoms could be signs of problems or infections that need to be evaluated by a medical provider now.  MAKE SURE YOU      Understand these instructions.  Will watch your condition.  Will get help right away if you are not doing well or get worse.  Your e-visit answers were reviewed by a board certified advanced clinical practitioner to complete your personal care plan. Depending upon the condition, your plan could have included  both over the counter or prescription medications. Please review your pharmacy choice to make sure that you have choses a pharmacy that is open for you to pick up any needed prescription, Your safety is important to us. If you have drug allergies check your prescription carefully.   You can use MyChart to ask questions about today's visit, request a non-urgent call back, or ask for a work or school excuse for 24 hours related to this e-Visit. If it has been greater than 24 hours you will need to follow up with your provider, or enter a new e-Visit to address those concerns. You will get a MyChart message within the next two days asking about your experience. I hope that your e-visit has been valuable and will speed your recovery.  I provided 5 minutes of non face-to-face time during this encounter for chart review and documentation.   

## 2020-08-08 ENCOUNTER — Other Ambulatory Visit: Payer: Medicaid Other

## 2020-09-06 ENCOUNTER — Other Ambulatory Visit: Payer: Self-pay

## 2020-09-06 ENCOUNTER — Encounter (HOSPITAL_COMMUNITY): Payer: Self-pay

## 2020-09-06 ENCOUNTER — Emergency Department (HOSPITAL_COMMUNITY): Payer: Medicaid Other

## 2020-09-06 ENCOUNTER — Emergency Department (HOSPITAL_COMMUNITY)
Admission: EM | Admit: 2020-09-06 | Discharge: 2020-09-06 | Disposition: A | Discharge status: Home / Self Care | Payer: Medicaid Other | Attending: Emergency Medicine | Admitting: Emergency Medicine

## 2020-09-06 DIAGNOSIS — N201 Calculus of ureter: Secondary | ICD-10-CM

## 2020-09-06 DIAGNOSIS — R109 Unspecified abdominal pain: Secondary | ICD-10-CM | POA: Diagnosis present

## 2020-09-06 DIAGNOSIS — N200 Calculus of kidney: Secondary | ICD-10-CM

## 2020-09-06 LAB — URINALYSIS, ROUTINE W REFLEX MICROSCOPIC
Bacteria, UA: NONE SEEN
Bilirubin Urine: NEGATIVE
Glucose, UA: NEGATIVE mg/dL
Ketones, ur: NEGATIVE mg/dL
Leukocytes,Ua: NEGATIVE
Nitrite: NEGATIVE
Protein, ur: NEGATIVE mg/dL
RBC / HPF: >50 RBC/hpf — ABNORMAL HIGH (ref 0–5)
Specific Gravity, Urine: 1.018 (ref 1.005–1.030)
pH: 6 (ref 5.0–8.0)

## 2020-09-06 LAB — PREGNANCY, URINE: Preg Test, Ur: NEGATIVE

## 2020-09-06 MED ORDER — KETOROLAC TROMETHAMINE 30 MG/ML IJ SOLN
15.0000 mg | Freq: Once | INTRAMUSCULAR | Status: AC
  Administered 2020-09-06: 15 mg via INTRAVENOUS
  Filled 2020-09-06: qty 1

## 2020-09-06 MED ORDER — OXYCODONE-ACETAMINOPHEN 5-325 MG PO TABS: 1.0000 | ORAL_TABLET | Freq: Four times a day (QID) | ORAL | 0 refills | Status: DC | PRN

## 2020-09-06 MED ORDER — HYDROMORPHONE HCL 1 MG/ML IJ SOLN
1.0000 mg | Freq: Once | INTRAMUSCULAR | Status: AC
  Administered 2020-09-06: 1 mg via INTRAVENOUS
  Filled 2020-09-06: qty 1

## 2020-09-06 MED ORDER — ONDANSETRON HCL 4 MG/2ML IJ SOLN
4.0000 mg | Freq: Once | INTRAMUSCULAR | Status: AC
  Administered 2020-09-06: 4 mg via INTRAVENOUS
  Filled 2020-09-06: qty 2

## 2020-09-06 MED ORDER — ONDANSETRON 8 MG PO TBDP: 8.0000 mg | ORAL_TABLET | Freq: Three times a day (TID) | ORAL | 0 refills | Status: DC | PRN

## 2020-09-06 NOTE — ED Provider Notes (Signed)
[facility] EMERGENCY DEPARTMENT Provider Note   CSN: [id number] Arrival date & time: 09/06/20  4166     History Chief Complaint  Patient presents with  . Flank Pain    Anna Hartman is a 30 y.o. female.  Patient c/o left flank pain. Symptoms acute onset in past day, constant, dull, moderate, non radiating. Denies hx same pain. No fever or chills. No dysuria or hematuria. No hx kidney stones. Prior cholecystectomy for gallstones. Denies vaginal bleeding or discharge, no pelvic pain. Has IUD. Denies abd wall or flank strain. No chest pain or discomfort. No increased cough. No pleuritic pain or sob. Normal appetite. No nausea or vomiting. No diarrhea or constipation.    Flank Pain Associated symptoms include abdominal pain. Pertinent negatives include no chest pain, no headaches and no shortness of breath.       Past Medical History:  Diagnosis Date  . Contraceptive management 03/23/2013  . Genital warts   . Miscarriage   . Missed periods 01/18/2014  . Positive pregnancy test 01/18/2014  . Pregnant 01/18/2014  . Supervision of normal pregnancy in first trimester 01/25/2014  . Urinary frequency 01/18/2014  . Vaginal discharge 03/08/2013  . Yeast infection 03/23/2013    Patient Active Problem List   Diagnosis Date Noted  . Anxiety and depression 02/15/2019  . BV (bacterial vaginosis) 08/05/2017  . Abnormal uterine bleeding (AUB) 08/05/2017  . Encounter for IUD insertion 09/25/2016    Past Surgical History:  Procedure Laterality Date  . ANKLE FRACTURE SURGERY    . CHOLECYSTECTOMY N/A 02/19/2015   Procedure: LAPAROSCOPIC CHOLECYSTECTOMY;  Surgeon: Franky Macho, MD;  Location: [facility];  Service: General;  Laterality: N/A;     OB History    Gravida  2   Para  1   Term  1   Preterm      AB  1   Living  1     SAB  1   IAB      Ectopic      Multiple  0   Live Births  1           Family History  Problem Relation Age of Onset  . Cancer Paternal  Grandmother   . COPD Maternal Grandmother   . Diabetes Father     Social History   Tobacco Use  . Smoking status: Never Smoker  . Smokeless tobacco: Never Used  Vaping Use  . Vaping Use: Never used  Substance Use Topics  . Alcohol use: Yes    Comment: occasionally  . Drug use: No    Home Medications Prior to Admission medications   Medication Sig Start Date End Date Taking? Authorizing Provider  metroNIDAZOLE (FLAGYL) 500 MG tablet Take 1 tablet (500 mg total) by mouth 2 (two) times daily. 06/15/20   Roxy Horseman, PA-C  sertraline (ZOLOFT) 25 MG tablet Take 1 tablet (25 mg total) by mouth daily. 08/25/19   Cheral Marker, CNM    Allergies    Patient has no known allergies.  Review of Systems   Review of Systems  Constitutional: Negative for chills and fever.  HENT: Negative for sore throat.   Eyes: Negative for redness.  Respiratory: Negative for cough and shortness of breath.   Cardiovascular: Negative for chest pain.  Gastrointestinal: Positive for abdominal pain. Negative for vomiting.  Genitourinary: Positive for flank pain. Negative for dysuria, hematuria, vaginal bleeding and vaginal discharge.  Musculoskeletal: Negative for back pain.  Skin: Negative for rash.  Neurological:  Negative for headaches.  Hematological: Does not bruise/bleed easily.  Psychiatric/Behavioral: Negative for confusion.    Physical Exam Updated Vital Signs BP (!) 132/97   Pulse 72   Temp 98 F (36.7 C) (Oral)   Resp 17   Ht 1.575 m (5\' 2" )   Wt 76.2 kg   SpO2 100%   BMI 30.73 kg/m   Physical Exam Vitals and nursing note reviewed.  Constitutional:      Appearance: Normal appearance. She is well-developed.  HENT:     Head: Atraumatic.     Nose: Nose normal.     Mouth/Throat:     Mouth: Mucous membranes are moist.  Eyes:     General: No scleral icterus.    Conjunctiva/sclera: Conjunctivae normal.  Neck:     Trachea: No tracheal deviation.  Cardiovascular:     Rate  and Rhythm: Normal rate and regular rhythm.     Pulses: Normal pulses.     Heart sounds: Normal heart sounds. No murmur heard. No friction rub. No gallop.   Pulmonary:     Effort: Pulmonary effort is normal. No respiratory distress.     Breath sounds: Normal breath sounds.  Abdominal:     General: Bowel sounds are normal. There is no distension.     Palpations: Abdomen is soft. There is no mass.     Tenderness: There is no abdominal tenderness. There is no guarding or rebound.     Hernia: No hernia is present.  Genitourinary:    Comments: No cva tenderness.  Musculoskeletal:        General: No swelling.     Cervical back: Normal range of motion and neck supple. No rigidity. No muscular tenderness.  Skin:    General: Skin is warm and dry.     Findings: No rash.  Neurological:     Mental Status: She is alert.     Comments: Alert, speech normal.   Psychiatric:        Mood and Affect: Mood normal.     ED Results / Procedures / Treatments   Labs (all labs ordered are listed, but only abnormal results are displayed) Results for orders placed or performed during the hospital encounter of 09/06/20  Urinalysis, Routine w reflex microscopic Urine, Random  Result Value Ref Range   Color, Urine YELLOW YELLOW   APPearance CLEAR CLEAR   Specific Gravity, Urine 1.018 1.005 - 1.030   pH 6.0 5.0 - 8.0   Glucose, UA NEGATIVE NEGATIVE mg/dL   Hgb urine dipstick LARGE (A) NEGATIVE   Bilirubin Urine NEGATIVE NEGATIVE   Ketones, ur NEGATIVE NEGATIVE mg/dL   Protein, ur NEGATIVE NEGATIVE mg/dL   Nitrite NEGATIVE NEGATIVE   Leukocytes,Ua NEGATIVE NEGATIVE   RBC / HPF >50 (H) 0 - 5 RBC/hpf   WBC, UA 0-5 0 - 5 WBC/hpf   Bacteria, UA NONE SEEN NONE SEEN   Squamous Epithelial / LPF 0-5 0 - 5   Mucus PRESENT   Pregnancy, urine  Result Value Ref Range   Preg Test, Ur NEGATIVE NEGATIVE   CT Renal Stone Study  Result Date: 09/06/2020 CLINICAL DATA:  Left-sided flank pain for 2 days EXAM: CT  ABDOMEN AND PELVIS WITHOUT CONTRAST TECHNIQUE: Multidetector CT imaging of the abdomen and pelvis was performed following the standard protocol without IV contrast. COMPARISON:  None. FINDINGS: Lower chest: No acute abnormality. Hepatobiliary: No focal liver abnormality is seen. Status post cholecystectomy. No biliary dilatation. Pancreas: Unremarkable. No pancreatic ductal dilatation or surrounding inflammatory  changes. Spleen: Normal in size without focal abnormality. Adrenals/Urinary Tract: Adrenal glands are within normal limits. Kidneys are well visualized bilaterally. No renal calculi are identified. Very mild fullness of the left renal collecting system and left ureter are seen. This extends to the level of the ureterovesical junction where a tiny 1-2 mm stone is identified best seen on image number 58 of series 5. Bladder is well distended. The right ureter is unremarkable. Stomach/Bowel: No obstructive or inflammatory changes of the colon are seen. Terminal ileum is within normal limits. The appendix is unremarkable. Small bowel and stomach are within normal limits. Vascular/Lymphatic: No significant vascular findings are present. No enlarged abdominal or pelvic lymph nodes. Reproductive: Uterus and bilateral adnexa are unremarkable. IUD is noted in place. Other: No abdominal wall hernia or abnormality. No abdominopelvic ascites. Musculoskeletal: No acute or significant osseous findings. IMPRESSION: 1-2 mm stone in the left UVJ with mild hydronephrosis and hydroureter. No other focal abnormality is noted. Electronically Signed   By: Alcide Clever M.D.   On: 09/06/2020 09:18    EKG None  Radiology CT Renal Stone Study  Result Date: 09/06/2020 CLINICAL DATA:  Left-sided flank pain for 2 days EXAM: CT ABDOMEN AND PELVIS WITHOUT CONTRAST TECHNIQUE: Multidetector CT imaging of the abdomen and pelvis was performed following the standard protocol without IV contrast. COMPARISON:  None. FINDINGS: Lower  chest: No acute abnormality. Hepatobiliary: No focal liver abnormality is seen. Status post cholecystectomy. No biliary dilatation. Pancreas: Unremarkable. No pancreatic ductal dilatation or surrounding inflammatory changes. Spleen: Normal in size without focal abnormality. Adrenals/Urinary Tract: Adrenal glands are within normal limits. Kidneys are well visualized bilaterally. No renal calculi are identified. Very mild fullness of the left renal collecting system and left ureter are seen. This extends to the level of the ureterovesical junction where a tiny 1-2 mm stone is identified best seen on image number 58 of series 5. Bladder is well distended. The right ureter is unremarkable. Stomach/Bowel: No obstructive or inflammatory changes of the colon are seen. Terminal ileum is within normal limits. The appendix is unremarkable. Small bowel and stomach are within normal limits. Vascular/Lymphatic: No significant vascular findings are present. No enlarged abdominal or pelvic lymph nodes. Reproductive: Uterus and bilateral adnexa are unremarkable. IUD is noted in place. Other: No abdominal wall hernia or abnormality. No abdominopelvic ascites. Musculoskeletal: No acute or significant osseous findings. IMPRESSION: 1-2 mm stone in the left UVJ with mild hydronephrosis and hydroureter. No other focal abnormality is noted. Electronically Signed   By: Alcide Clever M.D.   On: 09/06/2020 09:18    Procedures Procedures   Medications Ordered in ED Medications - No data to display  ED Course  I have reviewed the triage vital signs and the nursing notes.  Pertinent labs & imaging results that were available during my care of the patient were reviewed by me and considered in my medical decision making (see chart for details).    MDM Rules/Calculators/A&P                          Labs and imaging ordered.   Reviewed nursing notes and prior charts for additional history.   CT reviewed/interpreted by me -  +left ureteral stone.   Labs reviewed/interpreted by me - ua with rbcs.   Dilaudid iv. zofran iv. toradol iv.   Recheck pain improved.   Discussed ct w pt.   Rec urology f/u.      Final Clinical  Impression(s) / ED Diagnoses Final diagnoses:  None    Rx / DC Orders ED Discharge Orders    None       Cathren Laine, MD 09/06/20 1006

## 2020-09-06 NOTE — ED Triage Notes (Signed)
Pt presented to ED with left flank pain started yesterday.

## 2020-09-06 NOTE — Discharge Instructions (Addendum)
It was our pleasure to provide your ER care today - we hope that you feel better.   Your ct scan shows a 2 mm kidney stone in the distal ureter. Drink plenty of fluids/stay well hydrated. Strain urine.   Take motrin or aleve as need for pain. You may also take percocet as need for pain. No driving for the next 6 hours or when taking percocet. Also, do not take tylenol or acetaminophen containing medication when taking percocet.  You may take zofran as need for nausea.   Follow up with urologist in one week if symptoms fail to improve/resolve.  Return to ER if worse, new symptoms, fevers, severe or intractable pain, persistent vomiting, or other concern.

## 2020-09-24 ENCOUNTER — Ambulatory Visit: Payer: Medicaid Other | Admitting: Internal Medicine

## 2020-10-11 ENCOUNTER — Ambulatory Visit: Payer: Medicaid Other | Admitting: Internal Medicine

## 2020-10-30 ENCOUNTER — Ambulatory Visit: Payer: Medicaid Other | Admitting: Internal Medicine

## 2020-11-04 ENCOUNTER — Telehealth: Payer: Medicaid Other | Admitting: Family

## 2020-11-04 DIAGNOSIS — B3731 Acute candidiasis of vulva and vagina: Secondary | ICD-10-CM

## 2020-11-04 DIAGNOSIS — B373 Candidiasis of vulva and vagina: Secondary | ICD-10-CM | POA: Diagnosis not present

## 2020-11-04 MED ORDER — FLUCONAZOLE 150 MG PO TABS: 150.0000 mg | ORAL_TABLET | ORAL | 0 refills | Status: DC | PRN

## 2020-11-04 NOTE — Progress Notes (Signed)

## 2020-12-04 ENCOUNTER — Ambulatory Visit: Payer: Medicaid Other | Admitting: Internal Medicine

## 2021-01-21 ENCOUNTER — Telehealth: Payer: Medicaid Other | Admitting: Physician Assistant

## 2021-01-21 DIAGNOSIS — T3695XA Adverse effect of unspecified systemic antibiotic, initial encounter: Secondary | ICD-10-CM

## 2021-01-21 DIAGNOSIS — B379 Candidiasis, unspecified: Secondary | ICD-10-CM | POA: Diagnosis not present

## 2021-01-22 ENCOUNTER — Other Ambulatory Visit: Payer: Self-pay | Admitting: Physician Assistant

## 2021-01-22 MED ORDER — FLUCONAZOLE 150 MG PO TABS: 150.0000 mg | ORAL_TABLET | Freq: Once | ORAL | 0 refills | Status: AC

## 2021-01-22 NOTE — Progress Notes (Signed)
I have spent 5 minutes in review of e-visit questionnaire, review and updating patient chart, medical decision making and response to patient.   Sonya Gunnoe Cody Kwaku Mostafa, PA-C    

## 2021-01-22 NOTE — Progress Notes (Signed)

## 2021-03-08 ENCOUNTER — Telehealth: Payer: Medicaid Other | Admitting: Emergency Medicine

## 2021-03-08 DIAGNOSIS — J020 Streptococcal pharyngitis: Secondary | ICD-10-CM | POA: Diagnosis not present

## 2021-03-08 MED ORDER — PENICILLIN V POTASSIUM 500 MG PO TABS: 500.0000 mg | ORAL_TABLET | Freq: Two times a day (BID) | ORAL | 0 refills | Status: AC

## 2021-03-08 NOTE — Progress Notes (Signed)

## 2021-03-14 ENCOUNTER — Telehealth: Payer: Medicaid Other | Admitting: Physician Assistant

## 2021-03-14 DIAGNOSIS — B9689 Other specified bacterial agents as the cause of diseases classified elsewhere: Secondary | ICD-10-CM

## 2021-03-14 DIAGNOSIS — B3731 Acute candidiasis of vulva and vagina: Secondary | ICD-10-CM

## 2021-03-14 MED ORDER — METRONIDAZOLE 500 MG PO TABS
500.0000 mg | ORAL_TABLET | Freq: Two times a day (BID) | ORAL | 0 refills | Status: AC
Start: 2021-03-14 — End: 2021-03-21

## 2021-03-14 MED ORDER — FLUCONAZOLE 150 MG PO TABS: 150.0000 mg | ORAL_TABLET | Freq: Once | ORAL | 0 refills | Status: AC

## 2021-03-14 NOTE — Progress Notes (Signed)
E-Visit for Vaginal Symptoms  We are sorry that you are not feeling well. Here is how we plan to help! Based on what you shared with me it looks like you: May have a vaginosis due to bacteria and yeast vaginitis from recent antibiotic use.  Vaginosis is an inflammation of the vagina that can result in discharge, itching and pain. The cause is usually a change in the normal balance of vaginal bacteria or an infection. Vaginosis can also result from reduced estrogen levels after menopause.  The most common causes of vaginosis are:   Bacterial vaginosis which results from an overgrowth of one on several organisms that are normally present in your vagina.   Yeast infections which are caused by a naturally occurring fungus called candida.   Vaginal atrophy (atrophic vaginosis) which results from the thinning of the vagina from reduced estrogen levels after menopause.   Trichomoniasis which is caused by a parasite and is commonly transmitted by sexual intercourse.  Factors that increase your risk of developing vaginosis include: Medications, such as antibiotics and steroids Uncontrolled diabetes Use of hygiene products such as bubble bath, vaginal spray or vaginal deodorant Douching Wearing damp or tight-fitting clothing Using an intrauterine device (IUD) for birth control Hormonal changes, such as those associated with pregnancy, birth control pills or menopause Sexual activity Having a sexually transmitted infection  Your treatment plan is Metronidazole or Flagyl 500mg  twice a day for 7 days.  I have electronically sent this prescription into the pharmacy that you have chosen. I have also prescribed Diflucan 150mg  for possible yeast infection from recent antibiotics.   Be sure to take all of the medication as directed. Stop taking any medication if you develop a rash, tongue swelling or shortness of breath. Mothers who are breast feeding should consider pumping and discarding their breast  milk while on these antibiotics. However, there is no consensus that infant exposure at these doses would be harmful.  Remember that medication creams can weaken latex condoms.   HOME CARE:  Good hygiene may prevent some types of vaginosis from recurring and may relieve some symptoms:  Avoid baths, hot tubs and whirlpool spas. Rinse soap from your outer genital area after a shower, and dry the area well to prevent irritation. Don't use scented or harsh soaps, such as those with deodorant or antibacterial action. Avoid irritants. These include scented tampons and pads. Wipe from front to back after using the toilet. Doing so avoids spreading fecal bacteria to your vagina.  Other things that may help prevent vaginosis include:  Don't douche. Your vagina doesn't require cleansing other than normal bathing. Repetitive douching disrupts the normal organisms that reside in the vagina and can actually increase your risk of vaginal infection. Douching won't clear up a vaginal infection. Use a latex condom. Both female and female latex condoms may help you avoid infections spread by sexual contact. Wear cotton underwear. Also wear pantyhose with a cotton crotch. If you feel comfortable without it, skip wearing underwear to bed. Yeast thrives in Your symptoms should improve in the next day or two.  GET HELP RIGHT AWAY IF:  You have pain in your lower abdomen ( pelvic area or over your ovaries) You develop nausea or vomiting You develop a fever Your discharge changes or worsens You have persistent pain with intercourse You develop shortness of breath, a rapid pulse, or you faint.  These symptoms could be signs of problems or infections that need to be evaluated by  a medical provider now.  MAKE SURE YOU   Understand these instructions. Will watch your condition. Will get help right away if you are not doing well or get worse.  Thank you for choosing an e-visit.  Your  e-visit answers were reviewed by a board certified advanced clinical practitioner to complete your personal care plan. Depending upon the condition, your plan could have included both over the counter or prescription medications.  Please review your pharmacy choice. Make sure the pharmacy is open so you can pick up prescription now. If there is a problem, you may contact your provider through [employer] and have the prescription routed to another pharmacy.  Your safety is important to [place]. If you have drug allergies check your prescription carefully.   For the next 24 hours you can use MyChart to ask questions about today's visit, request a non-urgent call back, or ask for a work or school excuse. You will get an email in the next two days asking about your experience. I hope that your e-visit has been valuable and will speed your recovery.  I provided 7 minutes of non face-to-face time during this encounter for chart review and documentation.

## 2021-08-20 ENCOUNTER — Telehealth: Payer: Medicaid Other | Admitting: Physician Assistant

## 2021-08-20 DIAGNOSIS — N760 Acute vaginitis: Secondary | ICD-10-CM

## 2021-08-20 DIAGNOSIS — B9689 Other specified bacterial agents as the cause of diseases classified elsewhere: Secondary | ICD-10-CM | POA: Diagnosis not present

## 2021-08-21 MED ORDER — METRONIDAZOLE 500 MG PO TABS: 500.0000 mg | ORAL_TABLET | Freq: Two times a day (BID) | ORAL | 0 refills | Status: AC

## 2021-08-21 NOTE — Progress Notes (Signed)

## 2021-08-27 ENCOUNTER — Telehealth: Payer: Medicaid Other | Admitting: Physician Assistant

## 2021-08-27 DIAGNOSIS — B379 Candidiasis, unspecified: Secondary | ICD-10-CM | POA: Diagnosis not present

## 2021-08-27 DIAGNOSIS — T3695XA Adverse effect of unspecified systemic antibiotic, initial encounter: Secondary | ICD-10-CM | POA: Diagnosis not present

## 2021-08-27 MED ORDER — FLUCONAZOLE 150 MG PO TABS: 150.0000 mg | ORAL_TABLET | Freq: Once | ORAL | 0 refills | Status: AC

## 2021-08-27 NOTE — Progress Notes (Signed)
E-Visit for Vaginal Symptoms ? ?We are sorry that you are not feeling well. Here is how we plan to help! ?Based on what you shared with me it looks like you: May have a yeast vaginosis from antibiotic use.  ? ?Vaginosis is an inflammation of the vagina that can result in discharge, itching and pain. The cause is usually a change in the normal balance of vaginal bacteria or an infection. Vaginosis can also result from reduced estrogen levels after menopause. ? ?The most common causes of vaginosis are: ? ? Bacterial vaginosis which results from an overgrowth of one on several organisms that are normally present in your vagina. ? ? Yeast infections which are caused by a naturally occurring fungus called candida. ? ? Vaginal atrophy (atrophic vaginosis) which results from the thinning of the vagina from reduced estrogen levels after menopause. ? ? Trichomoniasis which is caused by a parasite and is commonly transmitted by sexual intercourse. ? ?Factors that increase your risk of developing vaginosis include: ?Medications, such as antibiotics and steroids ?Uncontrolled diabetes ?Use of hygiene products such as bubble bath, vaginal spray or vaginal deodorant ?Douching ?Wearing damp or tight-fitting clothing ?Using an intrauterine device (IUD) for birth control ?Hormonal changes, such as those associated with pregnancy, birth control pills or menopause ?Sexual activity ?Having a sexually transmitted infection ? ?Your treatment plan is A single Diflucan (fluconazole) 150mg  tablet once.  I have electronically sent this prescription into the pharmacy that you have chosen. ? ?Be sure to take all of the medication as directed. Stop taking any medication if you develop a rash, tongue swelling or shortness of breath. Mothers who are breast feeding should consider pumping and discarding their breast milk while on these antibiotics. However, there is no consensus that infant exposure at these doses would be harmful.  ?Remember that  medication creams can weaken latex condoms. ?. ? ? ?HOME CARE: ? ?Good hygiene may prevent some types of vaginosis from recurring and may relieve some symptoms: ? ?Avoid baths, hot tubs and whirlpool spas. Rinse soap from your outer genital area after a shower, and dry the area well to prevent irritation. Don't use scented or harsh soaps, such as those with deodorant or antibacterial action. ?Avoid irritants. These include scented tampons and pads. ?Wipe from front to back after using the toilet. Doing so avoids spreading fecal bacteria to your vagina. ? ?Other things that may help prevent vaginosis include: ? ?Don't douche. Your vagina doesn't require cleansing other than normal bathing. Repetitive douching disrupts the normal organisms that reside in the vagina and can actually increase your risk of vaginal infection. Douching won't clear up a vaginal infection. ?Use a latex condom. Both female and female latex condoms may help you avoid infections spread by sexual contact. ?Wear cotton underwear. Also wear pantyhose with a cotton crotch. If you feel comfortable without it, skip wearing underwear to bed. Yeast thrives in moist environments ?Your symptoms should improve in the next day or two. ? ?GET HELP RIGHT AWAY IF: ? ?You have pain in your lower abdomen ( pelvic area or over your ovaries) ?You develop nausea or vomiting ?You develop a fever ?Your discharge changes or worsens ?You have persistent pain with intercourse ?You develop shortness of breath, a rapid pulse, or you faint. ? ?These symptoms could be signs of problems or infections that need to be evaluated by a medical provider now. ? ?MAKE SURE YOU  ? ?Understand these instructions. ?Will watch your condition. ?Will get help right away  if you are not doing well or get worse. ? ?Thank you for choosing an e-visit. ? ?Your e-visit answers were reviewed by a board certified advanced clinical practitioner to complete your personal care plan. Depending upon the  condition, your plan could have included both over the counter or prescription medications. ? ?Please review your pharmacy choice. Make sure the pharmacy is open so you can pick up prescription now. If there is a problem, you may contact your provider through [employer] and have the prescription routed to another pharmacy.  Your safety is important to [place]. If you have drug allergies check your prescription carefully.  ? ?For the next 24 hours you can use MyChart to ask questions about today's visit, request a non-urgent call back, or ask for a work or school excuse. ?You will get an email in the next two days asking about your experience. I hope that your e-visit has been valuable and will speed your recovery. ? ?

## 2021-08-27 NOTE — Progress Notes (Signed)
I have spent 5 minutes in review of e-visit questionnaire, review and updating patient chart, medical decision making and response to patient.   Trude Cansler Cody Christorpher Hisaw, PA-C    

## 2021-10-13 ENCOUNTER — Telehealth: Payer: Medicaid Other | Admitting: Family

## 2021-10-13 DIAGNOSIS — B3731 Acute candidiasis of vulva and vagina: Secondary | ICD-10-CM

## 2021-10-13 MED ORDER — FLUCONAZOLE 150 MG PO TABS: 150.0000 mg | ORAL_TABLET | ORAL | 0 refills | Status: DC | PRN

## 2021-10-13 NOTE — Progress Notes (Signed)

## 2021-11-22 ENCOUNTER — Telehealth: Payer: Medicaid Other | Admitting: Physician Assistant

## 2021-11-22 DIAGNOSIS — K047 Periapical abscess without sinus: Secondary | ICD-10-CM

## 2021-11-22 MED ORDER — AMOXICILLIN 500 MG PO CAPS: 500.0000 mg | ORAL_CAPSULE | Freq: Three times a day (TID) | ORAL | 0 refills | Status: AC

## 2021-11-22 MED ORDER — IBUPROFEN 600 MG PO TABS: 600.0000 mg | ORAL_TABLET | Freq: Three times a day (TID) | ORAL | 0 refills | Status: DC | PRN

## 2021-11-22 NOTE — Progress Notes (Signed)

## 2022-01-23 ENCOUNTER — Telehealth: Payer: Medicaid Other | Admitting: Family Medicine

## 2022-01-23 DIAGNOSIS — T3695XA Adverse effect of unspecified systemic antibiotic, initial encounter: Secondary | ICD-10-CM

## 2022-01-23 DIAGNOSIS — B379 Candidiasis, unspecified: Secondary | ICD-10-CM | POA: Diagnosis not present

## 2022-01-23 MED ORDER — FLUCONAZOLE 150 MG PO TABS: 150.0000 mg | ORAL_TABLET | ORAL | 0 refills | Status: DC

## 2022-01-23 NOTE — Progress Notes (Signed)

## 2022-02-28 ENCOUNTER — Ambulatory Visit: Payer: Medicaid Other | Admitting: Adult Health

## 2022-04-03 ENCOUNTER — Telehealth: Payer: Medicaid Other | Admitting: Physician Assistant

## 2022-04-03 DIAGNOSIS — B379 Candidiasis, unspecified: Secondary | ICD-10-CM | POA: Diagnosis not present

## 2022-04-03 DIAGNOSIS — T3695XA Adverse effect of unspecified systemic antibiotic, initial encounter: Secondary | ICD-10-CM | POA: Diagnosis not present

## 2022-04-03 MED ORDER — FLUCONAZOLE 150 MG PO TABS: 150.0000 mg | ORAL_TABLET | ORAL | 0 refills | Status: DC

## 2022-04-03 NOTE — Progress Notes (Signed)
I have spent 5 minutes in review of e-visit questionnaire, review and updating patient chart, medical decision making and response to patient.   Roben Schliep Cody Waris Rodger, PA-C    

## 2022-04-03 NOTE — Progress Notes (Signed)

## 2022-06-03 ENCOUNTER — Telehealth: Payer: Medicaid Other | Admitting: Physician Assistant

## 2022-06-03 DIAGNOSIS — T3695XA Adverse effect of unspecified systemic antibiotic, initial encounter: Secondary | ICD-10-CM

## 2022-06-03 DIAGNOSIS — B379 Candidiasis, unspecified: Secondary | ICD-10-CM

## 2022-06-04 MED ORDER — FLUCONAZOLE 150 MG PO TABS: 150.0000 mg | ORAL_TABLET | ORAL | 0 refills | Status: DC | PRN

## 2022-06-04 NOTE — Progress Notes (Signed)
E-Visit for Vaginal Symptoms  We are sorry that you are not feeling well. Here is how we plan to help! Based on what you shared with me it looks like you: May have a yeast vaginosis  Vaginosis is an inflammation of the vagina that can result in discharge, itching and pain. The cause is usually a change in the normal balance of vaginal bacteria or an infection. Vaginosis can also result from reduced estrogen levels after menopause.  The most common causes of vaginosis are:   Bacterial vaginosis which results from an overgrowth of one on several organisms that are normally present in your vagina.   Yeast infections which are caused by a naturally occurring fungus called candida.   Vaginal atrophy (atrophic vaginosis) which results from the thinning of the vagina from reduced estrogen levels after menopause.   Trichomoniasis which is caused by a parasite and is commonly transmitted by sexual intercourse.  Factors that increase your risk of developing vaginosis include: Medications, such as antibiotics and steroids Uncontrolled diabetes Use of hygiene products such as bubble bath, vaginal spray or vaginal deodorant Douching Wearing damp or tight-fitting clothing Using an intrauterine device (IUD) for birth control Hormonal changes, such as those associated with pregnancy, birth control pills or menopause Sexual activity Having a sexually transmitted infection  Your treatment plan is Diflucan (fluconazole) 141m tablet once, may repeat in 72 hours if needed.  I have electronically sent this prescription into the pharmacy that you have chosen.  Be sure to take all of the medication as directed. Stop taking any medication if you develop a rash, tongue swelling or shortness of breath. Mothers who are breast feeding should consider pumping and discarding their breast milk while on these antibiotics. However, there is no consensus that infant exposure at these doses would be harmful.  Remember  that medication creams can weaken latex condoms. .Marland Kitchen  HOME CARE:  Good hygiene may prevent some types of vaginosis from recurring and may relieve some symptoms:  Avoid baths, hot tubs and whirlpool spas. Rinse soap from your outer genital area after a shower, and dry the area well to prevent irritation. Don't use scented or harsh soaps, such as those with deodorant or antibacterial action. Avoid irritants. These include scented tampons and pads. Wipe from front to back after using the toilet. Doing so avoids spreading fecal bacteria to your vagina.  Other things that may help prevent vaginosis include:  Don't douche. Your vagina doesn't require cleansing other than normal bathing. Repetitive douching disrupts the normal organisms that reside in the vagina and can actually increase your risk of vaginal infection. Douching won't clear up a vaginal infection. Use a latex condom. Both female and female latex condoms may help you avoid infections spread by sexual contact. Wear cotton underwear. Also wear pantyhose with a cotton crotch. If you feel comfortable without it, skip wearing underwear to bed. Yeast thrives in mCampbell SoupYour symptoms should improve in the next day or two.  GET HELP RIGHT AWAY IF:  You have pain in your lower abdomen ( pelvic area or over your ovaries) You develop nausea or vomiting You develop a fever Your discharge changes or worsens You have persistent pain with intercourse You develop shortness of breath, a rapid pulse, or you faint.  These symptoms could be signs of problems or infections that need to be evaluated by a medical provider now.  MAKE SURE YOU   Understand these instructions. Will watch your condition. Will get help right  away if you are not doing well or get worse.  Thank you for choosing an e-visit.  Your e-visit answers were reviewed by a board certified advanced clinical practitioner to complete your personal care plan. Depending  upon the condition, your plan could have included both over the counter or prescription medications.  Please review your pharmacy choice. Make sure the pharmacy is open so you can pick up prescription now. If there is a problem, you may contact your provider through [employer] and have the prescription routed to another pharmacy.  Your safety is important to [place]. If you have drug allergies check your prescription carefully.   For the next 24 hours you can use MyChart to ask questions about today's visit, request a non-urgent call back, or ask for a work or school excuse. You will get an email in the next two days asking about your experience. I hope that your e-visit has been valuable and will speed your recovery.  I have spent 5 minutes in review of e-visit questionnaire, review and updating patient chart, medical decision making and response to patient.   Mar Daring, PA-C

## 2022-07-02 ENCOUNTER — Telehealth: Payer: Commercial Managed Care - PPO | Admitting: Physician Assistant

## 2022-07-02 DIAGNOSIS — B379 Candidiasis, unspecified: Secondary | ICD-10-CM

## 2022-07-02 NOTE — Progress Notes (Signed)
Because you have had a few recurrent yeast-type vaginal infections recently without an in person exam, I feel your condition warrants further evaluation and I recommend that you be seen in a face to face visit.   NOTE: There will be NO CHARGE for this eVisit   If you are having a true medical emergency please call 911.      For an urgent face to face visit, [place] has eight urgent care centers for your convenience:   NEW!! [facility] [facility] [place] [facility] [place] Get Driving Directions [id number] [address] [place], [place] [facility] [place] [facility] [place] Get Driving Directions [id number] [place], [place] [address]   [place] [facility] [place] [facility]) Get Driving Directions [id number] [address], [place] [address]  [place] [facility] [place]) Get Driving Directions [id number] [address] [place],  [place]  [address]  [place] [facility] [place] [facility] - at [address] Get Driving Directions  [phone] [address] [place],  [place] [address]   [facility] Get Driving Directions [id number] [place], [place], [place] [address]   [place] [facility] Get Driving Directions  [id number] [address] [place], [place] [address]    [facility] at [place] Get Driving Directions [id number] [address], [place], [place] [address]  Your MyChart E-visit questionnaire answers were reviewed by a board certified advanced clinical practitioner to complete your personal care plan based on your specific symptoms.  Thank you for using e-Visits.   I have spent 5 minutes in review of e-visit questionnaire, review and updating patient chart, medical decision making and response to  patient.   Mar Daring, PA-C

## 2022-07-29 ENCOUNTER — Ambulatory Visit (INDEPENDENT_AMBULATORY_CARE_PROVIDER_SITE_OTHER): Payer: Commercial Managed Care - PPO | Admitting: Women's Health

## 2022-07-29 ENCOUNTER — Encounter: Payer: Commercial Managed Care - PPO | Admitting: Obstetrics & Gynecology

## 2022-07-29 ENCOUNTER — Encounter: Payer: Self-pay | Admitting: Women's Health

## 2022-07-29 ENCOUNTER — Other Ambulatory Visit (HOSPITAL_COMMUNITY)
Admission: RE | Admit: 2022-07-29 | Discharge: 2022-07-29 | Disposition: A | Discharge status: Home / Self Care | Payer: Commercial Managed Care - PPO | Source: Ambulatory Visit | Attending: Obstetrics & Gynecology | Admitting: Obstetrics & Gynecology

## 2022-07-29 VITALS — BP 131/82 | HR 80 | Ht 61.0 in | Wt 178.0 lb

## 2022-07-29 DIAGNOSIS — Z124 Encounter for screening for malignant neoplasm of cervix: Secondary | ICD-10-CM

## 2022-07-29 DIAGNOSIS — Z30432 Encounter for removal of intrauterine contraceptive device: Secondary | ICD-10-CM | POA: Diagnosis not present

## 2022-07-29 NOTE — Progress Notes (Signed)
   IUD REMOVAL  Patient name: Anna Hartman MRN [id number]  Date of birth: December 21, 1990 Subjective Findings:   Anna Hartman is a 32 y.o. G38P1011 Caucasian female being seen today for removal of a Paragard  IUD. Her IUD was placed 09/25/16.  She desires removal because of irregular periods, occ pain, not having sex w/ men anymore. Recent yeast infection, took diflucan- wants to make sure its gone. Signed copy of informed consent in chart.  No LMP recorded. (Menstrual status: IUD). Last pap 06/01/18. Results were: NILM w/ HRHPV negative The planned method of family planning is none (same sex relationship)     08/25/2019    8:37 AM 02/15/2019    2:35 PM 10/29/2016   11:09 AM  Depression screen PHQ 2/9  Decreased Interest Down, Depressed, Hopeless 2 1 0  PHQ - 2 Score Altered sleeping 0 0   Tired, decreased energy 1 1   Change in appetite 3 0   Feeling bad or failure about yourself  0 0   Trouble concentrating 0    Moving slowly or fidgety/restless 0 1   Suicidal thoughts 0 0   PHQ-9 Score 7 5   Difficult doing work/chores  Somewhat difficult         08/25/2019    8:37 AM  GAD 7 : Generalized Anxiety Score  Nervous, Anxious, on Edge 2  Control/stop worrying 2  Worry too much - different things 2  Trouble relaxing 1  Restless 2  Easily annoyed or irritable 2  Afraid - awful might happen 0  Total GAD 7 Score 11     Pertinent History Reviewed:   Reviewed past medical,surgical, social, obstetrical and family history.  Reviewed problem list, medications and allergies. Objective Findings & Procedure:    Vitals:   07/29/22 1057  BP: 131/82  Pulse: 80  Weight: 178 lb (80.7 kg)  Height:  (1.549 m)  Body mass index is 33.63 kg/m.  No results found for this or any previous visit (from the past 24 hour(s)).   Time out was performed. Performed by Bess Kinds, MD A graves speculum was placed in the vagina.  The cervix was visualized, pap obtained and  the strings ertr visible. They were grasped and the Paragard  IUD was easily removed intact without complications. The patient tolerated the procedure well.   Chaperone:  me    Assessment & Plan:   1) Paragard  IUD removal Follow-up prn problems, if sx not improved let us know  2) Screening for cervical cancer> pap today  No orders of the defined types were placed in this encounter.   Follow-up: Return in about 1 year (around 07/29/2023) for Physical.  Cheral Marker CNM, [facility] 07/29/2022 11:23 AM

## 2022-07-31 LAB — CYTOLOGY - PAP
Chlamydia: NEGATIVE
Comment: NEGATIVE
Comment: NEGATIVE
Comment: NEGATIVE
Comment: NORMAL
Diagnosis: NEGATIVE
High risk HPV: NEGATIVE
Neisseria Gonorrhea: NEGATIVE
Trichomonas: NEGATIVE

## 2022-09-02 ENCOUNTER — Encounter: Payer: Self-pay | Admitting: Women's Health

## 2022-09-02 ENCOUNTER — Other Ambulatory Visit: Payer: Self-pay | Admitting: Women's Health

## 2022-09-02 MED ORDER — FLUCONAZOLE 150 MG PO TABS: 150.0000 mg | ORAL_TABLET | Freq: Once | ORAL | 0 refills | Status: AC

## 2022-11-28 ENCOUNTER — Telehealth: Payer: Commercial Managed Care - PPO | Admitting: Physician Assistant

## 2022-11-28 DIAGNOSIS — K047 Periapical abscess without sinus: Secondary | ICD-10-CM

## 2022-11-28 MED ORDER — AMOXICILLIN-POT CLAVULANATE 875-125 MG PO TABS: 1.0000 | ORAL_TABLET | Freq: Two times a day (BID) | ORAL | 0 refills | Status: DC

## 2022-11-28 MED ORDER — NAPROXEN 500 MG PO TABS: 500.0000 mg | ORAL_TABLET | Freq: Two times a day (BID) | ORAL | 0 refills | Status: DC

## 2022-11-28 NOTE — Progress Notes (Signed)
I have spent 5 minutes in review of e-visit questionnaire, review and updating patient chart, medical decision making and response to patient.   William Cody Martin, PA-C    

## 2022-11-28 NOTE — Progress Notes (Signed)

## 2023-05-04 IMAGING — CT CT RENAL STONE PROTOCOL
2 of 4 series · 16 of 46 positions shown, 18 images · non-contrast
Comparison: None.

CLINICAL DATA: Left-sided flank pain for 2 days

EXAM:
CT ABDOMEN AND PELVIS WITHOUT CONTRAST
TECHNIQUE: Multidetector CT imaging of the abdomen and pelvis was performed
following the standard protocol without IV contrast.

[Series 2: axial st · axial · 0.82mm/px · z∈[-408,+6]mm · 13 of 93 slices shown, 15 images]
[im 5/93  soft-tissue]
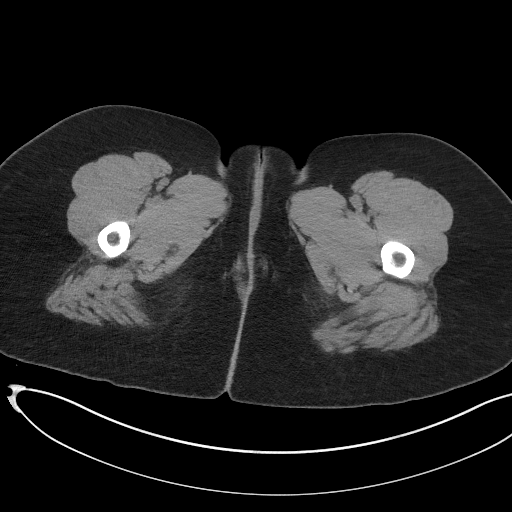
[im 5/93  bone]
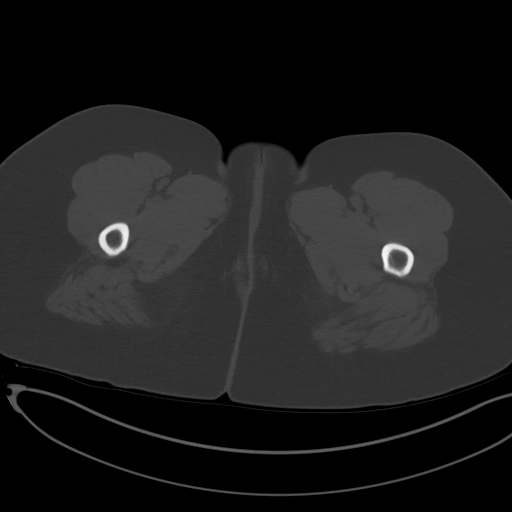
[im 14/93  soft-tissue]
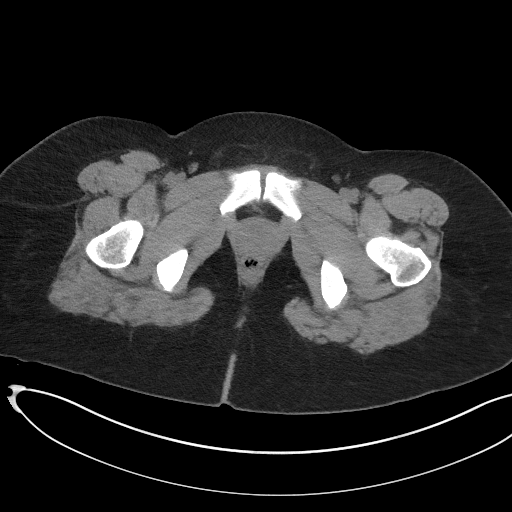
[im 19/93  soft-tissue]
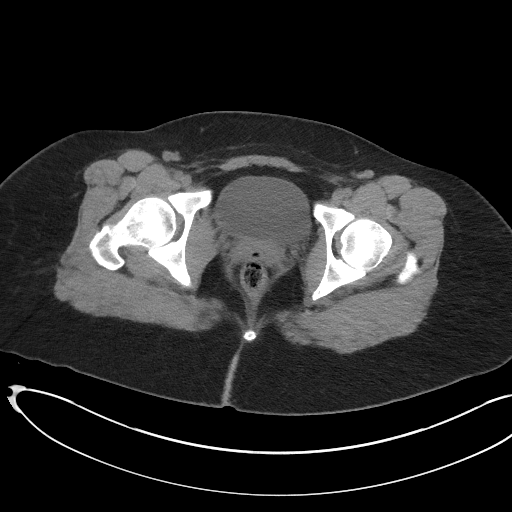
[im 28/93  soft-tissue]
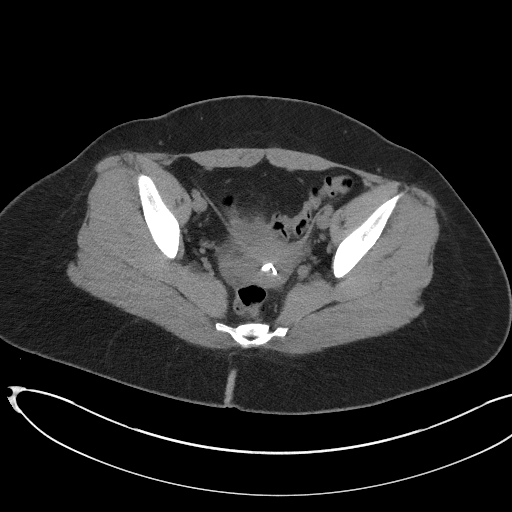
[im 33/93  soft-tissue]
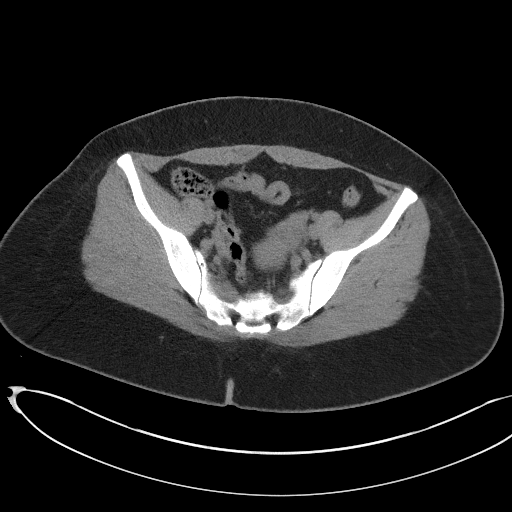
[im 42/93  soft-tissue]
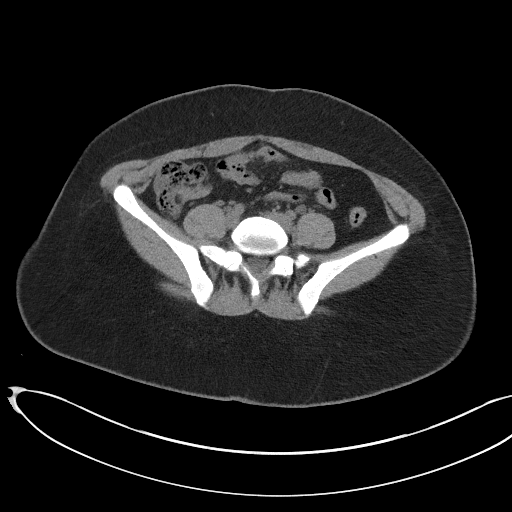
[im 47/93  soft-tissue]
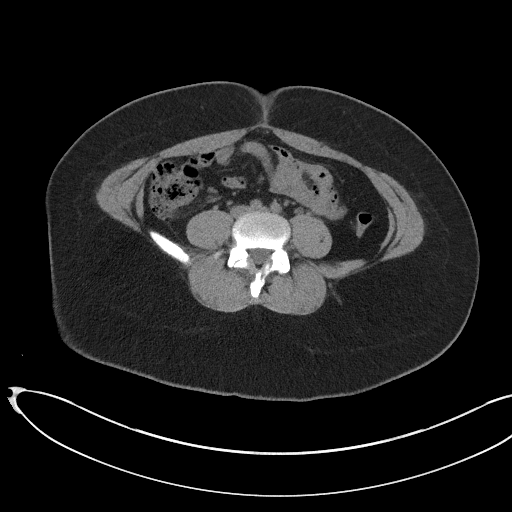
[im 51/93  soft-tissue]
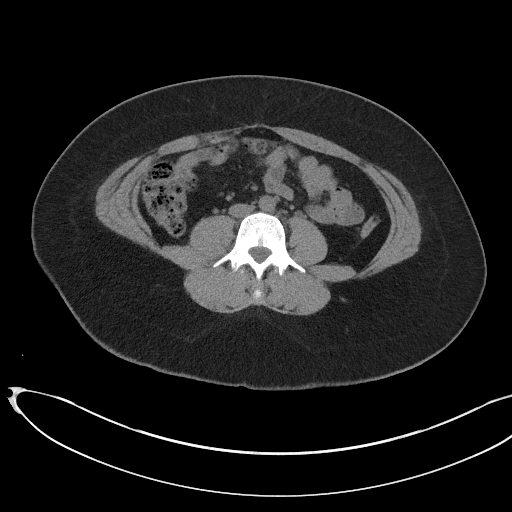
[im 60/93  soft-tissue]
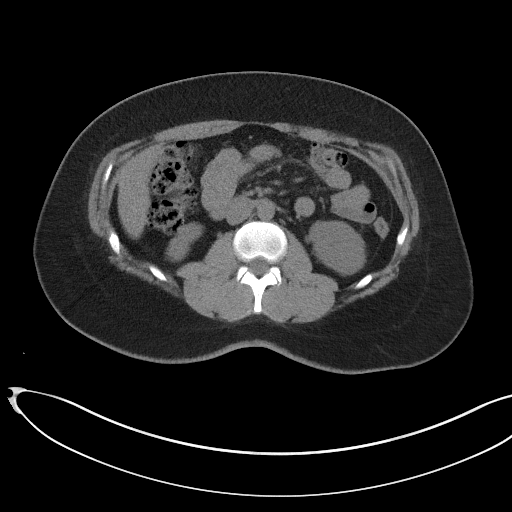
[im 60/93  bone]
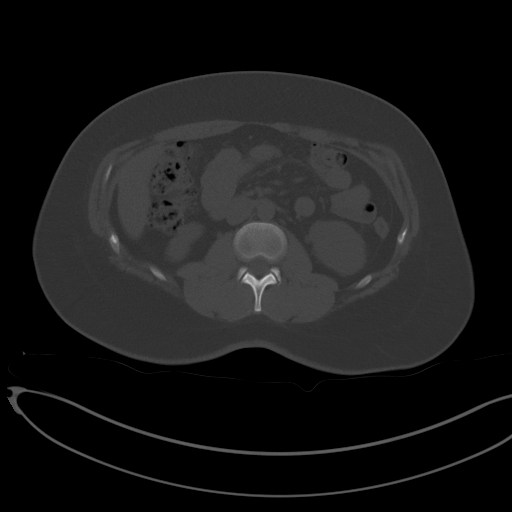
[im 65/93  soft-tissue]
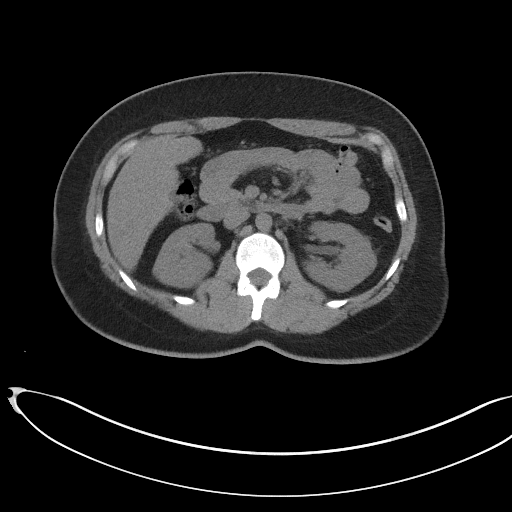
[im 74/93  soft-tissue]
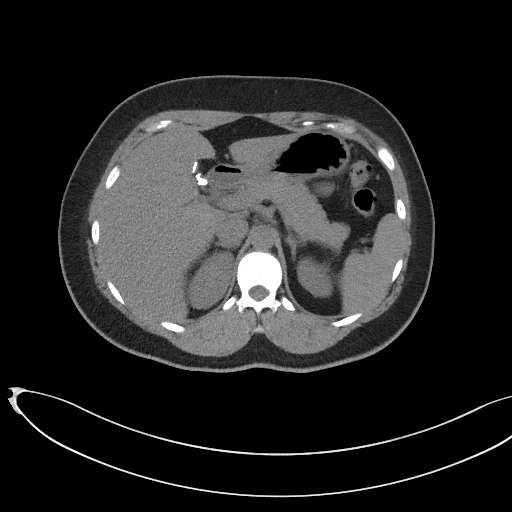
[im 79/93  soft-tissue]
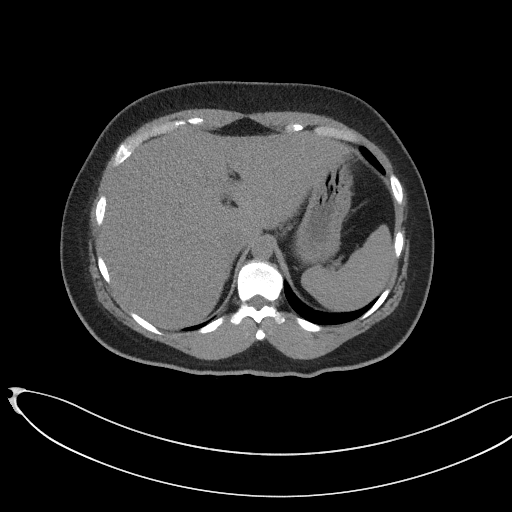
[im 88/93  soft-tissue]
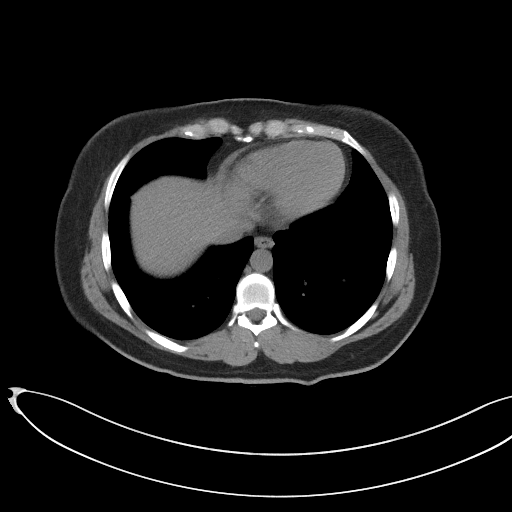

[Series 5: coronal st · coronal · 0.81mm/px · 3 of 104 slices shown]
[im 35/104  soft-tissue]
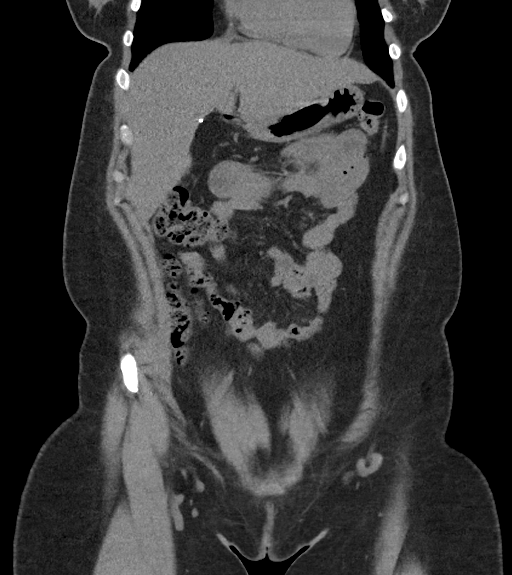
[im 46/104  soft-tissue]
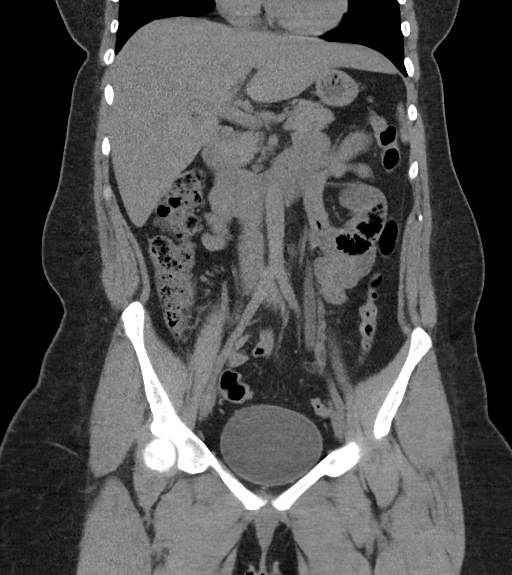
[im 58/104  soft-tissue]
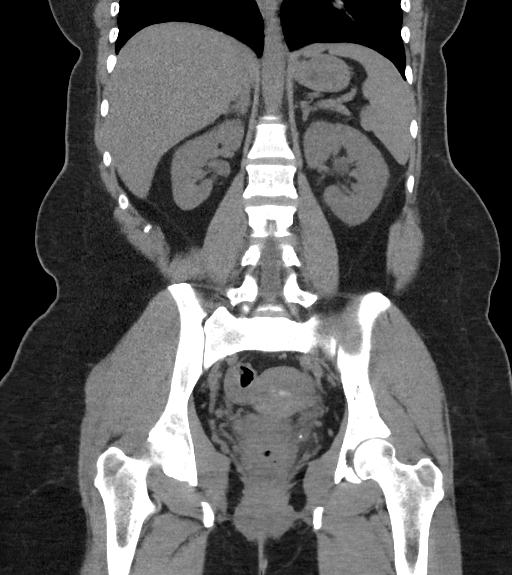

[16 of 46 positions shown; findings below may reference images not displayed]

FINDINGS: Lower chest: No acute abnormality.

Hepatobiliary: No focal liver abnormality is seen. Status post
cholecystectomy. No biliary dilatation.

Pancreas: Unremarkable. No pancreatic ductal dilatation or
surrounding inflammatory changes.

Spleen: Normal in size without focal abnormality.

Adrenals/Urinary Tract: Adrenal glands are within normal limits.
Kidneys are well visualized bilaterally. No renal calculi are
identified. Very mild fullness of the left renal collecting system
and left ureter are seen. This extends to the level of the
ureterovesical junction where a tiny 1-2 mm stone is identified best
seen on image number 58 of series 5. Bladder is well distended. The
right ureter is unremarkable.

Stomach/Bowel: No obstructive or inflammatory changes of the colon
are seen. Terminal ileum is within normal limits. The appendix is
unremarkable. Small bowel and stomach are within normal limits.

Vascular/Lymphatic: No significant vascular findings are present. No
enlarged abdominal or pelvic lymph nodes.

Reproductive: Uterus and bilateral adnexa are unremarkable. IUD is
noted in place.

Other: No abdominal wall hernia or abnormality. No abdominopelvic
ascites.

Musculoskeletal: No acute or significant osseous findings.
IMPRESSION: 1-2 mm stone in the left UVJ with mild hydronephrosis and
hydroureter.

No other focal abnormality is noted.

## 2023-08-13 ENCOUNTER — Ambulatory Visit (INDEPENDENT_AMBULATORY_CARE_PROVIDER_SITE_OTHER): Payer: Commercial Managed Care - PPO | Admitting: Internal Medicine

## 2023-08-13 ENCOUNTER — Encounter: Payer: Self-pay | Admitting: Internal Medicine

## 2023-08-13 VITALS — BP 119/76 | HR 72 | Ht 61.0 in | Wt 182.4 lb

## 2023-08-13 DIAGNOSIS — E785 Hyperlipidemia, unspecified: Secondary | ICD-10-CM

## 2023-08-13 DIAGNOSIS — S46212S Strain of muscle, fascia and tendon of other parts of biceps, left arm, sequela: Secondary | ICD-10-CM | POA: Diagnosis not present

## 2023-08-13 DIAGNOSIS — K219 Gastro-esophageal reflux disease without esophagitis: Secondary | ICD-10-CM | POA: Diagnosis not present

## 2023-08-13 DIAGNOSIS — S46212A Strain of muscle, fascia and tendon of other parts of biceps, left arm, initial encounter: Secondary | ICD-10-CM | POA: Insufficient documentation

## 2023-08-13 DIAGNOSIS — F411 Generalized anxiety disorder: Secondary | ICD-10-CM | POA: Diagnosis not present

## 2023-08-13 DIAGNOSIS — Z1159 Encounter for screening for other viral diseases: Secondary | ICD-10-CM

## 2023-08-13 DIAGNOSIS — E559 Vitamin D deficiency, unspecified: Secondary | ICD-10-CM

## 2023-08-13 DIAGNOSIS — R739 Hyperglycemia, unspecified: Secondary | ICD-10-CM

## 2023-08-13 MED ORDER — PANTOPRAZOLE SODIUM 40 MG PO TBEC: 40.0000 mg | DELAYED_RELEASE_TABLET | Freq: Every day | ORAL | 5 refills | Status: DC

## 2023-08-13 MED ORDER — NAPROXEN 500 MG PO TABS: 500.0000 mg | ORAL_TABLET | Freq: Two times a day (BID) | ORAL | 3 refills | Status: DC

## 2023-08-13 MED ORDER — HYDROXYZINE HCL 25 MG PO TABS: 25.0000 mg | ORAL_TABLET | Freq: Two times a day (BID) | ORAL | 2 refills | Status: AC | PRN

## 2023-08-13 NOTE — Progress Notes (Signed)
 New Patient Office Visit  Subjective:  Patient ID: Anna Hartman, female    DOB: 12/17/1990  Age: 33 y.o. MRN: [id number]  CC:  Chief Complaint  Patient presents with   Establish Care    Establishing care. Needs pcp. Would like general labs. Would like an rx for naproxen  and something stronger for GERD. Has trouble sleeping.     HPI Anna Hartman is a 34 y.o. female with past medical history of GERD and GAD who presents for establishing care.  GERD: She reports chronic epigastric pain and burning sensation in the throat area.  She has tried taking omeprazole  40 mg and later Nexium 40 mg QD without much relief.  She denies any melena or hematochezia currently.  She has difficulty swallowing at times, but not to any particular food.  Denies any recent change in bowel habits.  GAD: She complains of feeling stressed and lack of sleep, which are chronic for many years.  She has tried Zoloft  and Paxil  in the past without much relief.  She reports that she felt numb with Zoloft .  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  She is coparenting her daughter -every other week.  Denies any recent panic episode, but does report palpitations when she gets stressed.  Denies any chest pain or dyspnea currently.  She has history of traumatic left biceps tear, s/p operative repair.  She used to take naproxen  as needed for it, and requests refill of it.  Past Medical History:  Diagnosis Date   Contraceptive management 03/23/2013   Genital warts    Miscarriage    Missed periods 01/18/2014   Positive pregnancy test 01/18/2014   Pregnant 01/18/2014   Supervision of normal pregnancy in first trimester 01/25/2014   Urinary frequency 01/18/2014   Vaginal discharge 03/08/2013   Yeast infection 03/23/2013    Past Surgical History:  Procedure Laterality Date   ANKLE FRACTURE SURGERY     CHOLECYSTECTOMY N/A 02/19/2015   Procedure: LAPAROSCOPIC CHOLECYSTECTOMY;  Surgeon: Alanda Allegra,  MD;  Location: [facility];  Service: General;  Laterality: N/A;    Family History  Problem Relation Age of Onset   Cancer Paternal Grandmother    COPD Maternal Grandmother    Diabetes Father     Social History   Socioeconomic History   Marital status: Single    Spouse name: Not on file   Number of children: 1   Years of education: Not on file   Highest education level: Not on file  Occupational History   Not on file  Tobacco Use   Smoking status: Never   Smokeless tobacco: Never  Vaping Use   Vaping status: Never Used  Substance and Sexual Activity   Alcohol use: Yes    Comment: occasionally   Drug use: No   Sexual activity: Yes    Birth control/protection: I.U.D.  Other Topics Concern   Not on file  Social History Narrative   Not on file   Social Drivers of Health   Financial Resource Strain: Low Risk  (08/25/2019)   Overall Financial Resource Strain (CARDIA)    Difficulty of Paying Living Expenses: Not very hard  Food Insecurity: No Food Insecurity (08/25/2019)   Hunger Vital Sign    Worried About Running Out of Food in the Last Year: Never true    Ran Out of Food in the Last Year: Never true  Transportation Needs: No Transportation Needs (08/25/2019)   PRAPARE - Transportation  Lack of Transportation (Medical): No    Lack of Transportation (Non-Medical): No  Physical Activity: Sufficiently Active (08/25/2019)   Exercise Vital Sign    Days of Exercise per Week: 3 days    Minutes of Exercise per Session: 60 min  Stress: Stress Concern Present (08/25/2019)   [employer] of Occupational Health - Occupational Stress Questionnaire    Feeling of Stress : Rather much  Social Connections: Moderately Isolated (08/25/2019)   Social Connection and Isolation Panel [NHANES]    Frequency of Communication with Friends and Family: More than three times a week    Frequency of Social Gatherings with Friends and Family: More than three times a week    Attends Religious  Services: Never    Database administrator or Organizations: No    Attends Banker Meetings: Never    Marital Status: Living with partner  Intimate Partner Violence: Not At Risk (08/25/2019)   Humiliation, Afraid, Rape, and Kick questionnaire    Fear of Current or Ex-Partner: No    Emotionally Abused: No    Physically Abused: No    Sexually Abused: No    ROS Review of Systems  Constitutional:  Negative for chills and fever.  HENT:  Negative for congestion, sinus pressure, sinus pain and sore throat.   Eyes:  Negative for pain and discharge.  Respiratory:  Negative for cough and shortness of breath.   Cardiovascular:  Negative for chest pain and palpitations.  Gastrointestinal:  Positive for abdominal pain (Epigastric). Negative for diarrhea, nausea and vomiting.  Endocrine: Negative for polydipsia and polyuria.  Genitourinary:  Negative for dysuria and hematuria.  Musculoskeletal:  Negative for neck pain and neck stiffness.  Skin:  Negative for rash.  Neurological:  Negative for dizziness and weakness.  Psychiatric/Behavioral:  Positive for sleep disturbance. Negative for agitation and behavioral problems. The patient is nervous/anxious.     Objective:   Today's Vitals: BP 119/76   Pulse 72   Ht 5\' 1"  (1.549 m)   Wt 182 lb 6.4 oz (82.7 kg)   LMP 06/15/2023   SpO2 97%   BMI 34.46 kg/m   Physical Exam Vitals reviewed.  Constitutional:      General: She is not in acute distress.    Appearance: She is not diaphoretic.  HENT:     Head: Normocephalic and atraumatic.     Nose: Nose normal.     Mouth/Throat:     Mouth: Mucous membranes are moist.  Eyes:     General: No scleral icterus.    Extraocular Movements: Extraocular movements intact.  Cardiovascular:     Rate and Rhythm: Normal rate and regular rhythm.     Heart sounds: Normal heart sounds. No murmur heard. Pulmonary:     Breath sounds: Normal breath sounds. No wheezing or rales.  Abdominal:      Palpations: Abdomen is soft.     Tenderness: There is abdominal tenderness (Mild, epigastric).  Musculoskeletal:     Cervical back: Neck supple. No tenderness.     Right lower leg: No edema.     Left lower leg: No edema.  Skin:    General: Skin is warm.     Findings: No rash.  Neurological:     General: No focal deficit present.     Mental Status: She is alert and oriented to person, place, and time.  Psychiatric:        Mood and Affect: Mood is anxious.  Behavior: Behavior normal.     Assessment & Plan:   Problem List Items Addressed This Visit       Digestive   Gastroesophageal reflux disease - Primary   Uncontrolled, has tried omeprazole  and Nexium Started pantoprazole  40 mg daily Advised to take Pepcid in the evening as needed for persistent acid reflux Referred to GI - may need EGD with H. pylori testing      Relevant Medications   pantoprazole  (PROTONIX ) 40 MG tablet   Other Relevant Orders   Ambulatory referral to Gastroenterology   CMP14+EGFR   CBC with Differential/Platelet     Musculoskeletal and Integument   Tear of left biceps muscle   S/p operative repair Has intermittent left arm pain Refilled naproxen  as needed for now, advised to take Tylenol  arthritis preferably for arm pain considering her GERD      Relevant Medications   naproxen  (NAPROSYN ) 500 MG tablet     Other   GAD (generalized anxiety disorder)   Uncontrolled, multiple factors increasing her stress levels including 2 stressful jobs and her daughter's performance Advised to perform relaxation techniques, can try yoga Started hydroxyzine  25 mg twice daily as needed, she is mostly going to take it at bedtime for insomnia If persistent, will consider SSRI - has already tried his Zoloft  and Paxil  without much benefit in the past      Relevant Medications   hydrOXYzine  (ATARAX ) 25 MG tablet   Other Relevant Orders   TSH   CMP14+EGFR   CBC with Differential/Platelet   Other Visit  Diagnoses       Need for hepatitis C screening test       Relevant Orders   Hepatitis C Antibody     Hyperlipidemia, unspecified hyperlipidemia type       Relevant Orders   Lipid panel     Hyperglycemia       Relevant Orders   Hemoglobin A1c     Vitamin D deficiency       Relevant Orders   VITAMIN D 25 Hydroxy (Vit-D Deficiency, Fractures)       Outpatient Encounter Medications as of 08/13/2023  Medication Sig   Fexofenadine HCl (ALLEGRA ALLERGY PO) Take by mouth.   hydrOXYzine  (ATARAX ) 25 MG tablet Take 1 tablet (25 mg total) by mouth 2 (two) times daily as needed for anxiety.   pantoprazole  (PROTONIX ) 40 MG tablet Take 1 tablet (40 mg total) by mouth daily.   [DISCONTINUED] omeprazole  (PRILOSEC) 40 MG capsule Take 40 mg by mouth daily.   naproxen  (NAPROSYN ) 500 MG tablet Take 1 tablet (500 mg total) by mouth 2 (two) times daily with a meal.   [DISCONTINUED] amoxicillin -clavulanate (AUGMENTIN ) 875-125 MG tablet Take 1 tablet by mouth 2 (two) times daily.   [DISCONTINUED] naproxen  (NAPROSYN ) 500 MG tablet Take 1 tablet (500 mg total) by mouth 2 (two) times daily with a meal. (Patient not taking: Reported on 08/13/2023)   No facility-administered encounter medications on file as of 08/13/2023.    Follow-up: Return in about 3 months (around 11/13/2023) for GAD.   Meldon Sport, MD

## 2023-08-13 NOTE — Assessment & Plan Note (Signed)
 Uncontrolled, multiple factors increasing her stress levels including 2 stressful jobs and her daughter's performance Advised to perform relaxation techniques, can try yoga Started hydroxyzine  25 mg twice daily as needed, she is mostly going to take it at bedtime for insomnia If persistent, will consider SSRI - has already tried his Zoloft  and Paxil  without much benefit in the past

## 2023-08-13 NOTE — Patient Instructions (Signed)
 Please start taking Pantoprazole  as prescribed for acid reflux. Okay to take Pepcid in the evening for persistent acid reflux.  Please start taking Hydroxyzine  at bedtime.

## 2023-08-13 NOTE — Assessment & Plan Note (Signed)
 S/p operative repair Has intermittent left arm pain Refilled naproxen  as needed for now, advised to take Tylenol  arthritis preferably for arm pain considering her GERD

## 2023-08-13 NOTE — Assessment & Plan Note (Signed)
 Uncontrolled, has tried omeprazole  and Nexium Started pantoprazole  40 mg daily Advised to take Pepcid in the evening as needed for persistent acid reflux Referred to GI - may need EGD with H. pylori testing

## 2023-08-14 LAB — CBC WITH DIFFERENTIAL/PLATELET
Basophils Absolute: 0.1 10*3/uL (ref 0.0–0.2)
Basos: 1 %
EOS (ABSOLUTE): 0.3 10*3/uL (ref 0.0–0.4)
Eos: 3 %
Hematocrit: 41.8 % (ref 34.0–46.6)
Hemoglobin: 13.5 g/dL (ref 11.1–15.9)
Immature Grans (Abs): 0 10*3/uL (ref 0.0–0.1)
Immature Granulocytes: 0 %
Lymphocytes Absolute: 2.9 10*3/uL (ref 0.7–3.1)
Lymphs: 32 %
MCH: 27.5 pg (ref 26.6–33.0)
MCHC: 32.3 g/dL (ref 31.5–35.7)
MCV: 85 fL (ref 79–97)
Monocytes Absolute: 0.4 10*3/uL (ref 0.1–0.9)
Monocytes: 5 %
Neutrophils Absolute: 5.5 10*3/uL (ref 1.4–7.0)
Neutrophils: 59 %
Platelets: 277 10*3/uL (ref 150–450)
RBC: 4.91 x10E6/uL (ref 3.77–5.28)
RDW: 13.8 % (ref 11.7–15.4)
WBC: 9.2 10*3/uL (ref 3.4–10.8)

## 2023-08-14 LAB — CMP14+EGFR
ALT: 9 IU/L (ref 0–32)
AST: 20 IU/L (ref 0–40)
Albumin: 4.6 g/dL (ref 3.9–4.9)
Alkaline Phosphatase: 96 IU/L (ref 44–121)
BUN/Creatinine Ratio: 16 (ref 9–23)
BUN: 13 mg/dL (ref 6–20)
Bilirubin Total: 0.5 mg/dL (ref 0.0–1.2)
CO2: 21 mmol/L (ref 20–29)
Calcium: 9.7 mg/dL (ref 8.7–10.2)
Chloride: 100 mmol/L (ref 96–106)
Creatinine, Ser: 0.79 mg/dL (ref 0.57–1.00)
Globulin, Total: 3.1 g/dL (ref 1.5–4.5)
Glucose: 140 mg/dL — ABNORMAL HIGH (ref 70–99)
Potassium: 4.7 mmol/L (ref 3.5–5.2)
Sodium: 138 mmol/L (ref 134–144)
Total Protein: 7.7 g/dL (ref 6.0–8.5)
eGFR: 100 mL/min/{1.73_m2} (ref >59)

## 2023-08-14 LAB — VITAMIN D 25 HYDROXY (VIT D DEFICIENCY, FRACTURES): Vit D, 25-Hydroxy: 27.2 ng/mL — ABNORMAL LOW (ref 30.0–100.0)

## 2023-08-14 LAB — LIPID PANEL
Chol/HDL Ratio: 6.1 ratio — ABNORMAL HIGH (ref 0.0–4.4)
Cholesterol, Total: 213 mg/dL — ABNORMAL HIGH (ref 100–199)
HDL: 35 mg/dL — ABNORMAL LOW (ref >39)
LDL Chol Calc (NIH): 149 mg/dL — ABNORMAL HIGH (ref 0–99)
Triglycerides: 160 mg/dL — ABNORMAL HIGH (ref 0–149)
VLDL Cholesterol Cal: 29 mg/dL (ref 5–40)

## 2023-08-14 LAB — HEMOGLOBIN A1C
Est. average glucose Bld gHb Est-mCnc: 174 mg/dL
Hgb A1c MFr Bld: 7.7 % — ABNORMAL HIGH (ref 4.8–5.6)

## 2023-08-14 LAB — TSH: TSH: 1.46 u[IU]/mL (ref 0.450–4.500)

## 2023-08-14 LAB — HEPATITIS C ANTIBODY: Hep C Virus Ab: NONREACTIVE

## 2023-08-17 ENCOUNTER — Encounter: Payer: Self-pay | Admitting: Gastroenterology

## 2023-08-18 ENCOUNTER — Encounter: Payer: Self-pay | Admitting: Internal Medicine

## 2023-08-18 ENCOUNTER — Telehealth: Payer: Self-pay | Admitting: Pharmacy Technician

## 2023-08-18 ENCOUNTER — Other Ambulatory Visit (HOSPITAL_COMMUNITY): Payer: Self-pay

## 2023-08-18 ENCOUNTER — Telehealth (INDEPENDENT_AMBULATORY_CARE_PROVIDER_SITE_OTHER): Admitting: Internal Medicine

## 2023-08-18 DIAGNOSIS — E1165 Type 2 diabetes mellitus with hyperglycemia: Secondary | ICD-10-CM | POA: Diagnosis not present

## 2023-08-18 DIAGNOSIS — E782 Mixed hyperlipidemia: Secondary | ICD-10-CM | POA: Diagnosis not present

## 2023-08-18 MED ORDER — ATORVASTATIN CALCIUM 20 MG PO TABS: 20.0000 mg | ORAL_TABLET | Freq: Every day | ORAL | 3 refills | Status: AC

## 2023-08-18 MED ORDER — LANCETS MISC. MISC: 1.0000 | Freq: Three times a day (TID) | 0 refills | Status: AC

## 2023-08-18 MED ORDER — BLOOD GLUCOSE TEST VI STRP: 1.0000 | ORAL_STRIP | Freq: Three times a day (TID) | 0 refills | Status: AC

## 2023-08-18 MED ORDER — OZEMPIC (0.25 OR 0.5 MG/DOSE) 2 MG/3ML ~~LOC~~ SOPN: PEN_INJECTOR | SUBCUTANEOUS | 3 refills | Status: DC

## 2023-08-18 MED ORDER — BLOOD GLUCOSE MONITORING SUPPL DEVI: 1.0000 | Freq: Three times a day (TID) | 0 refills | Status: AC

## 2023-08-18 NOTE — Assessment & Plan Note (Signed)
 Lab Results  Component Value Date   HGBA1C 7.7 (H) 08/13/2023   New onset Started Ozempic considering its cardiovascular benefits - increase dose as tolerated Advised to follow diabetic diet On statin now F/u CMP and lipid panel Diabetic eye exam: Advised to follow up with Ophthalmology for diabetic eye exam

## 2023-08-18 NOTE — Assessment & Plan Note (Signed)
 Lipid profile reviewed Started atorvastatin 20 mg QD Advised to follow low-carb diet

## 2023-08-18 NOTE — Telephone Encounter (Signed)
 Pharmacy Patient Advocate Encounter  Received notification from [facility] that Prior Authorization for Ozempic (0.25 or 0.5 MG/DOSE) 2MG /3ML pen-injectors has been APPROVED from 08/18/2023 to 08/17/2024. Ran test claim, Copay is $300.95. This test claim was processed through [facility]- copay amounts may vary at other pharmacies due to pharmacy/plan contracts, or as the patient moves through the different stages of their insurance plan.   PA #/Case ID/Reference #: [id number]  COPAY APPLIED TO AND MEETS PT DEDUCTIBLE OF $150.00. $138.16 APPLIED TO DEDUCTIBLE PLUS THE COINSURANCE OF $162.79=$300.95

## 2023-08-18 NOTE — Progress Notes (Signed)
 Virtual Visit via Video Note   Because of Anna Hartman's co-morbid illnesses, she is at least at moderate risk for complications without adequate follow up.  This format is felt to be most appropriate for this patient at this time.  All issues noted in this document were discussed and addressed.  A limited physical exam was performed with this format.      Evaluation Performed:  Follow-up visit  Date:  08/18/2023   ID:  Anna Hartman, DOB 06-Mar-1991, MRN [id number]  Patient Location: Home Provider Location: Office/Clinic  Participants: Patient Location of Patient: Home Location of Provider: Telehealth Consent was obtain for visit to be over via telehealth. I verified that I am speaking with the correct person using two identifiers.  PCP:  Meldon Sport, MD   Chief Complaint: F/u of DM and HLD  History of Present Illness:    Anna Hartman is a 33 y.o. female with PMH of newly diagnosed DM, HLD, GERD and GAD who has a video visit for follow-up of DM and HLD.  DM: Her HbA1c is 7.7 - new onset DM. She has chronic fatigue, but denies polyuria or polyphagia currently.  She is willing to improve her diet and increase her physical activity.  HLD: Lipid profile showed elevated LDL - 149.  The patient does not have symptoms concerning for COVID-19 infection (fever, chills, cough, or new shortness of breath).   Past Medical, Surgical, Social History, Allergies, and Medications have been Reviewed.  Past Medical History:  Diagnosis Date   Contraceptive management 03/23/2013   Genital warts    Miscarriage    Missed periods 01/18/2014   Positive pregnancy test 01/18/2014   Pregnant 01/18/2014   Supervision of normal pregnancy in first trimester 01/25/2014   Urinary frequency 01/18/2014   Vaginal discharge 03/08/2013   Yeast infection 03/23/2013   Past Surgical History:  Procedure Laterality Date   ANKLE FRACTURE SURGERY     CHOLECYSTECTOMY N/A 02/19/2015    Procedure: LAPAROSCOPIC CHOLECYSTECTOMY;  Surgeon: Alanda Allegra, MD;  Location: [facility];  Service: General;  Laterality: N/A;     Current Meds  Medication Sig   Fexofenadine HCl (ALLEGRA ALLERGY PO) Take by mouth.   hydrOXYzine  (ATARAX ) 25 MG tablet Take 1 tablet (25 mg total) by mouth 2 (two) times daily as needed for anxiety.   naproxen  (NAPROSYN ) 500 MG tablet Take 1 tablet (500 mg total) by mouth 2 (two) times daily with a meal.   pantoprazole  (PROTONIX ) 40 MG tablet Take 1 tablet (40 mg total) by mouth daily.     Allergies:   Patient has no known allergies.   ROS:   Please see the history of present illness. All other systems reviewed and are negative.   Labs/Other Tests and Data Reviewed:    Recent Labs: 08/13/2023: ALT 9; BUN 13; Creatinine, Ser 0.79; Hemoglobin 13.5; Platelets 277; Potassium 4.7; Sodium 138; TSH 1.460   Recent Lipid Panel Lab Results  Component Value Date/Time   CHOL 213 (H) 08/13/2023 10:00 AM   TRIG 160 (H) 08/13/2023 10:00 AM   HDL 35 (L) 08/13/2023 10:00 AM   CHOLHDL 6.1 (H) 08/13/2023 10:00 AM   LDLCALC 149 (H) 08/13/2023 10:00 AM    Wt Readings from Last 3 Encounters:  08/13/23 182 lb 6.4 oz (82.7 kg)  07/29/22 178 lb (80.7 kg)  09/06/20 168 lb (76.2 kg)     Objective:    Vital Signs:  LMP 06/15/2023    VITAL  SIGNS:  reviewed GEN:  no acute distress EYES:  sclerae anicteric, EOMI - Extraocular Movements Intact RESPIRATORY:  normal respiratory effort, symmetric expansion NEURO:  alert and oriented x 3, no obvious focal deficit PSYCH:  normal affect  ASSESSMENT & PLAN:    Type 2 diabetes mellitus with hyperglycemia (HCC) Lab Results  Component Value Date   HGBA1C 7.7 (H) 08/13/2023   New onset Started Ozempic considering its cardiovascular benefits - increase dose as tolerated Advised to follow diabetic diet On statin now F/u CMP and lipid panel Diabetic eye exam: Advised to follow up with Ophthalmology for diabetic eye  exam  Mixed hyperlipidemia Lipid profile reviewed Started atorvastatin 20 mg QD Advised to follow low-carb diet   I discussed the assessment and treatment plan with the patient. The patient was provided an opportunity to ask questions, and all were answered. The patient agreed with the plan and demonstrated an understanding of the instructions.   The patient was advised to call back or seek an in-person evaluation if the symptoms worsen or if the condition fails to improve as anticipated.  The above assessment and management plan was discussed with the patient. The patient verbalized understanding of and has agreed to the management plan.   Medication Adjustments/Labs and Tests Ordered: Current medicines are reviewed at length with the patient today.  Concerns regarding medicines are outlined above.   Tests Ordered: No orders of the defined types were placed in this encounter.   Medication Changes: No orders of the defined types were placed in this encounter.    Note: This dictation was prepared with Dragon dictation along with smaller phrase technology. Similar sounding words can be transcribed inadequately or may not be corrected upon review. Any transcriptional errors that result from this process are unintentional.      Disposition:  Follow up  Signed, Meldon Sport, MD  08/18/2023 11:25 AM     [facility]

## 2023-08-18 NOTE — Telephone Encounter (Signed)
 Pharmacy Patient Advocate Encounter   Received notification from CoverMyMeds that prior authorization for Ozempic (0.25 or 0.5 MG/DOSE) 2MG /3ML pen-injectors is required/requested.   Insurance verification completed.   The patient is insured through [employer] .   Per test claim: PA required; PA submitted to above mentioned insurance via CoverMyMeds Key/confirmation #/EOC [id number] Status is pending

## 2023-08-18 NOTE — Addendum Note (Signed)
 Addended byCleola Dach on: 08/18/2023 11:55 AM   Modules accepted: Level of Service

## 2023-08-18 NOTE — Patient Instructions (Addendum)
 Please start taking Ozempic as prescribed, at 0.25 mg once weekly for 4 weeks and then 0.50 mg once weekly after it.  Please maintain low-carb diet and follow small, frequent meals.  Please start taking atorvastatin as prescribed.  Please look for coupon for Ozempic on the following link: [email or web address]

## 2023-08-23 NOTE — Progress Notes (Unsigned)
 Referring Provider: Meldon Sport, MD Primary Care Physician:  Meldon Sport, MD Primary Gastroenterologist:  Dr. Riley Cheadle  Chief Complaint  Patient presents with   Gastroesophageal Reflux    Really bad acid reflux. It is coming back up in her throat.     HPI:   Anna Hartman is a 33 y.o. female presenting today at the request of Meldon Sport, MD for GERD.   Office visit with Dr. Lydia Sams 08/13/2023.  Patient reported chronic epigastric pain and burning in the throat area.  She has tried omeprazole  40 mg and Nexium 40 mg daily without much relief.  Also reports difficulty swallowing at times.  She was started on pantoprazole  40 mg daily and Pepcid in the evening for persistent reflux.  Also referred to GI for possible EGD and H. pylori testing.   Today:  Chronic GERD. Started 10 years ago during pregnancy. Was mild, controlled with tums. In the past 2-3 years, symptoms have been worsening, and in the past year, even worse.   Was taking prilosec 20 mg. Increased to 40 mg daily. Started pantoprazole  40 mg on 5/1 and is taking at bedtime because she remembers it better this way.  Seems to be helping better than omeprazole , but still having symptoms daily.  In the last couple of weeks, she has had acid, going up into her throat, burning her throat.  Occasional dysphagia. Solid foods. More of a recent issue. Liquids will wash items down.  Has been having some nausea without vomiting.  Increased with Ozempic .  Zofran  not helpful.  Has been avoiding spicy foods.  No regular soda at this time.  She does drink coffee and energy drinks.  NSAIDs:  No naprozen.  Occasional ibuprofen  1-2 times a week. 800 mg.     Past Medical History:  Diagnosis Date   Contraceptive management 03/23/2013   Diabetes (HCC)    Genital warts    Miscarriage    Missed periods 01/18/2014   Positive pregnancy test 01/18/2014   Pregnant 01/18/2014   Supervision of normal pregnancy in first trimester  01/25/2014   Urinary frequency 01/18/2014   Vaginal discharge 03/08/2013   Yeast infection 03/23/2013    Past Surgical History:  Procedure Laterality Date   ANKLE FRACTURE SURGERY     CHOLECYSTECTOMY N/A 02/19/2015   Procedure: LAPAROSCOPIC CHOLECYSTECTOMY;  Surgeon: Alanda Allegra, MD;  Location: [facility];  Service: General;  Laterality: N/A;   left shoulder surgery      Current Outpatient Medications  Medication Sig Dispense Refill   atorvastatin  (LIPITOR) 20 MG tablet Take 1 tablet (20 mg total) by mouth daily. 90 tablet 3   Blood Glucose Monitoring Suppl DEVI 1 each by Does not apply route in the morning, at noon, and at bedtime. May substitute to any manufacturer covered by patient's insurance. 1 each 0   Fexofenadine HCl (ALLEGRA ALLERGY PO) Take by mouth.     Glucose Blood (BLOOD GLUCOSE TEST STRIPS) STRP 1 each by In Vitro route in the morning, at noon, and at bedtime. May substitute to any manufacturer covered by patient's insurance. 100 strip 0   hydrOXYzine  (ATARAX ) 25 MG tablet Take 1 tablet (25 mg total) by mouth 2 (two) times daily as needed for anxiety. 60 tablet 2   ondansetron  (ZOFRAN ) 4 MG tablet Take 1 tablet (4 mg total) by mouth every 8 (eight) hours as needed for nausea or vomiting. 20 tablet 0   promethazine  (PHENERGAN ) 25 MG tablet Take 1 tablet (25  mg total) by mouth every 8 (eight) hours as needed for nausea or vomiting. 30 tablet 0   Semaglutide ,0.25 or 0.5MG /DOS, (OZEMPIC , 0.25 OR 0.5 MG/DOSE,) 2 MG/3ML SOPN Inject 0.25 mg into the skin every 7 (seven) days for 28 days, THEN 0.5 mg every 7 (seven) days. 3 mL 3   sucralfate (CARAFATE) 1 g tablet Take 1 g by mouth 4 (four) times daily.     Lancets Misc. MISC 1 each by Does not apply route in the morning, at noon, and at bedtime. May substitute to any manufacturer covered by patient's insurance. 100 each 0   pantoprazole  (PROTONIX ) 40 MG tablet Take 1 tablet (40 mg total) by mouth 2 (two) times daily. 60 tablet 5   No  current facility-administered medications for this visit.    Allergies as of 08/26/2023   (No Known Allergies)    Family History  Problem Relation Age of Onset   Cancer Paternal Grandmother    COPD Maternal Grandmother    Diabetes Father     Social History   Socioeconomic History   Marital status: Single    Spouse name: Not on file   Number of children: 1   Years of education: Not on file   Highest education level: Not on file  Occupational History   Not on file  Tobacco Use   Smoking status: Never   Smokeless tobacco: Never  Vaping Use   Vaping status: Never Used  Substance and Sexual Activity   Alcohol use: Yes    Comment: occasionally   Drug use: No   Sexual activity: Yes    Birth control/protection: I.U.D.  Other Topics Concern   Not on file  Social History Narrative   Not on file   Social Drivers of Health   Financial Resource Strain: Low Risk  (08/25/2019)   Overall Financial Resource Strain (CARDIA)    Difficulty of Paying Living Expenses: Not very hard  Food Insecurity: No Food Insecurity (08/25/2019)   Hunger Vital Sign    Worried About Running Out of Food in the Last Year: Never true    Ran Out of Food in the Last Year: Never true  Transportation Needs: No Transportation Needs (08/25/2019)   PRAPARE - Administrator, Civil Service (Medical): No    Lack of Transportation (Non-Medical): No  Physical Activity: Sufficiently Active (08/25/2019)   Exercise Vital Sign    Days of Exercise per Week: 3 days    Minutes of Exercise per Session: 60 min  Stress: Stress Concern Present (08/25/2019)   [employer] of Occupational Health - Occupational Stress Questionnaire    Feeling of Stress : Rather much  Social Connections: Moderately Isolated (08/25/2019)   Social Connection and Isolation Panel [NHANES]    Frequency of Communication with Friends and Family: More than three times a week    Frequency of Social Gatherings with Friends and Family:  More than three times a week    Attends Religious Services: Never    Database administrator or Organizations: No    Attends Banker Meetings: Never    Marital Status: Living with partner  Intimate Partner Violence: Not At Risk (08/25/2019)   Humiliation, Afraid, Rape, and Kick questionnaire    Fear of Current or Ex-Partner: No    Emotionally Abused: No    Physically Abused: No    Sexually Abused: No    Review of Systems: Gen: Denies any fever, chills, cold flulike symptoms, presyncope, syncope. CV: Denies chest  pain, heart palpitations. Resp: Denies shortness of breath, cough. GI: See HPI GU : Denies urinary burning, urinary frequency, urinary hesitancy MS: Denies joint pain. Derm: Denies rash. Psych: Denies depression, anxiety. Heme: See HPI  Physical Exam: BP 121/81 (BP Location: Right Arm, Patient Position: Sitting, Cuff Size: Large)   Pulse 80   Temp 97.7 F (36.5 C) (Temporal)   Ht 5\' 1"  (1.549 m)   Wt 183 lb (83 kg)   LMP 08/19/2023 (Approximate)   BMI 34.58 kg/m  General:   Alert and oriented. Pleasant and cooperative. Well-nourished and well-developed.  Head:  Normocephalic and atraumatic. Eyes:  Without icterus, sclera clear and conjunctiva pink.  Ears:  Normal auditory acuity. Lungs:  Clear to auscultation bilaterally. No wheezes, rales, or rhonchi. No distress.  Heart:  S1, S2 present without murmurs appreciated.  Abdomen:  +BS, soft, and non-distended.  Mild TTP in epigastric area.  No HSM noted. No guarding or rebound. No masses appreciated.  Rectal:  Deferred  Msk:  Symmetrical without gross deformities. Normal posture. Extremities:  Without edema. Neurologic:  Alert and  oriented x4;  grossly normal neurologically. Skin:  Intact without significant lesions or rashes. Psych:  Normal mood and affect.    Assessment:  33 year old female with history of anxiety, GERD, presenting today for further evaluation and management of  GERD.  GERD: Present for the last 10 years, but increasing in frequency and severity.  Symptoms previously controlled with Tums, then started Prilosec which is increased to 40 mg daily, and just recently switched to pantoprazole  40 mg daily on 5/1, but continues to have daily breakthrough symptoms.  Diet, specifically frequent caffeine consumption is likely playing a role here.  Intermittent NSAID use may also be contributing as well.  However, considering chronicity of symptoms that are worsening requiring increasing doses of PPI, I have recommended EGD to rule out gastritis, duodenitis, PUD, H. pylori.   Dysphagia: Intermittent solid food dysphagia.  Symptoms may be related to uncontrolled GERD, esophagitis, esophageal web, ring, stricture, EOE.  Needs EGD for further evaluation.  Nausea without vomiting: Possibly secondary to uncontrolled GERD, but more likely related to Ozempic .  Reports symptoms do not respond to Zofran  and would like to try something different.  She was given a prescription of Phenergan  today and recommended follow-up with PCP as I suspect Ozempic  is the driving factor behind her worsening nausea. We are arrange EGD for GERD/dysphagia as per above which will also help to evaluate her symptoms.    Plan:  Proceed with upper endoscopy +/- dilation with propofol  by Dr. Riley Cheadle in near future. The risks, benefits, and alternatives have been discussed with the patient in detail. The patient states understanding and desires to proceed.  ASA 2 UPT prior Hold Ozempic  x 1 week prior Increase pantoprazole  to 40 mg twice daily Counseled on GERD diet/lifestyle.  Written instructions provided on AVS. Avoid all NSAID products. Phenergan  25 mg q8 hours as needed. Follow-up with PCP on nausea/Ozempic . Follow-up in our office after EGD.   Anna Daring, PA-C [facility] 08/26/2023

## 2023-08-24 ENCOUNTER — Telehealth: Payer: Self-pay | Admitting: Internal Medicine

## 2023-08-24 ENCOUNTER — Other Ambulatory Visit: Payer: Self-pay | Admitting: Internal Medicine

## 2023-08-24 DIAGNOSIS — R110 Nausea: Secondary | ICD-10-CM | POA: Insufficient documentation

## 2023-08-24 MED ORDER — ONDANSETRON HCL 4 MG PO TABS: 4.0000 mg | ORAL_TABLET | Freq: Three times a day (TID) | ORAL | 0 refills | Status: AC | PRN

## 2023-08-24 NOTE — Telephone Encounter (Signed)
 Copied from CRM [id number]. Topic: Clinical - Medication Question >> Aug 24, 2023  2:04 PM Antwanette L wrote: Reason for CRM: Patient is currently taking  Semaglutide ,0.25 or 0.5MG /DOS, (OZEMPIC , 0.25 OR 0.5 MG/DOSE,) 2 MG/3ML SOPN and its making her nauseous. Patient wants to know if Dr.Patel can prescribe some medicine for the nausea. Please contact patient at [phone].   Preferred Pharmacy [facility] [id number] [place] [address] Phone: [phone] Fax: [phone]

## 2023-08-24 NOTE — Telephone Encounter (Signed)
 Please advise  Patient is also requesting a prescription to help with nausea. Patient wants a call back in regard     Copied from CRM [id number]. Topic: General - Other >> Aug 24, 2023  2:05 PM Antwanette L wrote: Reason for CRM: Patient saw Dr.Patel on 5/06 and prescribed Semaglutide ,0.25 or 0.5MG /DOS, (OZEMPIC , 0.25 OR 0.5 MG/DOSE,) 2 MG/3ML SOPN. The medicine is making the patient nauseous. The patient missed work today. The patient is requesting a work excuse for 08/24/23 and 08/25/23. Please send the excuse to the patient email at  [email or web address]

## 2023-08-24 NOTE — Telephone Encounter (Signed)
 Message sent to provider to approve work note

## 2023-08-24 NOTE — Telephone Encounter (Signed)
 Note sent

## 2023-08-26 ENCOUNTER — Encounter: Payer: Self-pay | Admitting: *Deleted

## 2023-08-26 ENCOUNTER — Encounter: Payer: Self-pay | Admitting: Gastroenterology

## 2023-08-26 ENCOUNTER — Ambulatory Visit (INDEPENDENT_AMBULATORY_CARE_PROVIDER_SITE_OTHER): Admitting: Gastroenterology

## 2023-08-26 VITALS — BP 121/81 | HR 80 | Temp 97.7°F | Ht 61.0 in | Wt 183.0 lb

## 2023-08-26 DIAGNOSIS — K219 Gastro-esophageal reflux disease without esophagitis: Secondary | ICD-10-CM | POA: Diagnosis not present

## 2023-08-26 DIAGNOSIS — R1310 Dysphagia, unspecified: Secondary | ICD-10-CM

## 2023-08-26 DIAGNOSIS — R110 Nausea: Secondary | ICD-10-CM

## 2023-08-26 MED ORDER — PROMETHAZINE HCL 25 MG PO TABS: 25.0000 mg | ORAL_TABLET | Freq: Three times a day (TID) | ORAL | 0 refills | Status: DC | PRN

## 2023-08-26 MED ORDER — PANTOPRAZOLE SODIUM 40 MG PO TBEC: 40.0000 mg | DELAYED_RELEASE_TABLET | Freq: Two times a day (BID) | ORAL | 5 refills | Status: AC

## 2023-08-26 NOTE — Patient Instructions (Addendum)
 Increase pantoprazole  to 40 mg twice daily 30 minutes before breakfast and dinner.   Will get you scheduled for an upper endoscopy with possible stretching of your esophagus in the near future with Dr. Riley Cheadle.  I am sending in a prescription of Phenergan  for you to use as needed for nausea.  I would like for you to follow-up with your primary care doctor on this as well as I think this is likely primarily driven by Ozempic .  Follow a GERD diet/lifestyle:  Avoid fried, fatty, greasy, spicy, citrus foods. Avoid caffeine and carbonated beverages. Avoid chocolate. Try eating 4-6 small meals a day rather than 3 large meals. Do not eat within 3 hours of laying down. Prop head of bed up on wood or bricks to create a 6 inch incline.  I also recommend that you avoid all NSAID products including naproxen , ibuprofen , Aleve , Advil , BC powders, Goody powders, anything that says "NSAID" on the package.  I will see you back in the office after your procedure.  Shana Daring, PA-C [facility]

## 2023-09-17 NOTE — Telephone Encounter (Signed)
 LMTCB for pt

## 2023-10-03 ENCOUNTER — Telehealth: Admitting: Physician Assistant

## 2023-10-03 DIAGNOSIS — B3731 Acute candidiasis of vulva and vagina: Secondary | ICD-10-CM

## 2023-10-03 MED ORDER — FLUCONAZOLE 150 MG PO TABS: 150.0000 mg | ORAL_TABLET | ORAL | 0 refills | Status: AC

## 2023-10-03 NOTE — Progress Notes (Signed)

## 2023-10-03 NOTE — Progress Notes (Signed)
 I have spent 5 minutes in review of e-visit questionnaire, review and updating patient chart, medical decision making and response to patient.   Laure Kidney, PA-C

## 2023-10-22 ENCOUNTER — Other Ambulatory Visit: Payer: Self-pay | Admitting: Internal Medicine

## 2023-10-22 ENCOUNTER — Telehealth: Payer: Self-pay

## 2023-10-22 NOTE — Telephone Encounter (Signed)
 Copied from CRM [id number]. Topic: Clinical - Prescription Issue >> Oct 22, 2023  1:42 PM Silvana PARAS wrote: Reason for CRM: Pt calling to adjust Semaglutide ,0.25 or 0.5MG /DOS, (OZEMPIC , 0.25 OR 0.5 MG/DOSE,) 2 MG/3ML SOPN. Callback number is [phone].

## 2023-10-23 ENCOUNTER — Other Ambulatory Visit: Payer: Self-pay | Admitting: Internal Medicine

## 2023-10-23 DIAGNOSIS — E1165 Type 2 diabetes mellitus with hyperglycemia: Secondary | ICD-10-CM

## 2023-10-23 MED ORDER — OZEMPIC (0.25 OR 0.5 MG/DOSE) 2 MG/3ML ~~LOC~~ SOPN: 0.5000 mg | PEN_INJECTOR | SUBCUTANEOUS | 1 refills | Status: DC

## 2023-10-23 NOTE — Telephone Encounter (Signed)
 Pt advised with verbal understanding

## 2023-10-23 NOTE — Telephone Encounter (Signed)
 Pt states she can't get it due to the pharmacy is filling it for 0.25mg  and not 0.5mg  states she needs a new rx for it

## 2023-10-30 ENCOUNTER — Telehealth: Admitting: Family Medicine

## 2023-10-30 DIAGNOSIS — B3731 Acute candidiasis of vulva and vagina: Secondary | ICD-10-CM | POA: Diagnosis not present

## 2023-10-31 ENCOUNTER — Telehealth

## 2023-10-31 DIAGNOSIS — B9689 Other specified bacterial agents as the cause of diseases classified elsewhere: Secondary | ICD-10-CM

## 2023-10-31 MED ORDER — FLUCONAZOLE 150 MG PO TABS: 150.0000 mg | ORAL_TABLET | Freq: Every day | ORAL | 0 refills | Status: DC

## 2023-10-31 NOTE — Progress Notes (Signed)
 E-Visit for Vaginal Symptoms  We are sorry that you are not feeling well. Here is how we plan to help! Based on what you shared with me it looks like you: 2  Vaginosis is an inflammation of the vagina that can result in discharge, itching and pain. The cause is usually a change in the normal balance of vaginal bacteria or an infection. Vaginosis can also result from reduced estrogen levels after menopause.  The most common causes of vaginosis are:   Bacterial vaginosis which results from an overgrowth of one on several organisms that are normally present in your vagina.   Yeast infections which are caused by a naturally occurring fungus called candida.   Vaginal atrophy (atrophic vaginosis) which results from the thinning of the vagina from reduced estrogen levels after menopause.   Trichomoniasis which is caused by a parasite and is commonly transmitted by sexual intercourse.  Factors that increase your risk of developing vaginosis include: Medications, such as antibiotics and steroids Uncontrolled diabetes Use of hygiene products such as bubble bath, vaginal spray or vaginal deodorant Douching Wearing damp or tight-fitting clothing Using an intrauterine device (IUD) for birth control Hormonal changes, such as those associated with pregnancy, birth control pills or menopause Sexual activity Having a sexually transmitted infection  Your treatment plan is A single Diflucan  (fluconazole ) 150mg  tablet once.  I have electronically sent this prescription into the pharmacy that you have chosen.  Be sure to take all of the medication as directed. Stop taking any medication if you develop a rash, tongue swelling or shortness of breath. Mothers who are breast feeding should consider pumping and discarding their breast milk while on these antibiotics. However, there is no consensus that infant exposure at these doses would be harmful.  Remember that medication creams can weaken latex  condoms. SABRA   HOME CARE:  Good hygiene may prevent some types of vaginosis from recurring and may relieve some symptoms:  Avoid baths, hot tubs and whirlpool spas. Rinse soap from your outer genital area after a shower, and dry the area well to prevent irritation. Don't use scented or harsh soaps, such as those with deodorant or antibacterial action. Avoid irritants. These include scented tampons and pads. Wipe from front to back after using the toilet. Doing so avoids spreading fecal bacteria to your vagina.  Other things that may help prevent vaginosis include:  Don't douche. Your vagina doesn't require cleansing other than normal bathing. Repetitive douching disrupts the normal organisms that reside in the vagina and can actually increase your risk of vaginal infection. Douching won't clear up a vaginal infection. Use a latex condom. Both female and female latex condoms may help you avoid infections spread by sexual contact. Wear cotton underwear. Also wear pantyhose with a cotton crotch. If you feel comfortable without it, skip wearing underwear to bed. Yeast thrives in [employer] Your symptoms should improve in the next day or two.  GET HELP RIGHT AWAY IF:  You have pain in your lower abdomen ( pelvic area or over your ovaries) You develop nausea or vomiting You develop a fever Your discharge changes or worsens You have persistent pain with intercourse You develop shortness of breath, a rapid pulse, or you faint.  These symptoms could be signs of problems or infections that need to be evaluated by a medical provider now.  MAKE SURE YOU   Understand these instructions. Will watch your condition. Will get help right away if you are not doing well or get  worse.  Thank you for choosing an e-visit.  Your e-visit answers were reviewed by a board certified advanced clinical practitioner to complete your personal care plan. Depending upon the condition, your plan could have  included both over the counter or prescription medications.  Please review your pharmacy choice. Make sure the pharmacy is open so you can pick up prescription now. If there is a problem, you may contact your provider through [employer] and have the prescription routed to another pharmacy.  Your safety is important to us . If you have drug allergies check your prescription carefully.   For the next 24 hours you can use MyChart to ask questions about today's visit, request a non-urgent call back, or ask for a work or school excuse. You will get an email in the next two days asking about your experience. I hope that your e-visit has been valuable and will speed your recovery.    have provided 5 minutes of non face to face time during this encounter for chart review and documentation.

## 2023-11-01 MED ORDER — FLUCONAZOLE 150 MG PO TABS: 150.0000 mg | ORAL_TABLET | ORAL | 0 refills | Status: AC | PRN

## 2023-11-01 MED ORDER — METRONIDAZOLE 500 MG PO TABS: 500.0000 mg | ORAL_TABLET | Freq: Two times a day (BID) | ORAL | 0 refills | Status: AC

## 2023-11-01 NOTE — Addendum Note (Signed)
 Addended by: LAVELL LYE A on: 11/01/2023 09:16 AM   Modules accepted: Orders

## 2023-11-01 NOTE — Progress Notes (Signed)

## 2023-11-17 ENCOUNTER — Ambulatory Visit: Admitting: Internal Medicine

## 2023-11-27 ENCOUNTER — Encounter: Payer: Self-pay | Admitting: Internal Medicine

## 2023-12-18 ENCOUNTER — Other Ambulatory Visit: Payer: Self-pay | Admitting: Internal Medicine

## 2023-12-18 DIAGNOSIS — E1165 Type 2 diabetes mellitus with hyperglycemia: Secondary | ICD-10-CM

## 2023-12-31 ENCOUNTER — Encounter: Payer: Self-pay | Admitting: Internal Medicine

## 2023-12-31 ENCOUNTER — Ambulatory Visit (INDEPENDENT_AMBULATORY_CARE_PROVIDER_SITE_OTHER): Admitting: Internal Medicine

## 2023-12-31 VITALS — BP 118/75 | HR 81 | Ht 61.0 in | Wt 169.8 lb

## 2023-12-31 DIAGNOSIS — E1165 Type 2 diabetes mellitus with hyperglycemia: Secondary | ICD-10-CM | POA: Diagnosis not present

## 2023-12-31 DIAGNOSIS — L239 Allergic contact dermatitis, unspecified cause: Secondary | ICD-10-CM

## 2023-12-31 DIAGNOSIS — F411 Generalized anxiety disorder: Secondary | ICD-10-CM | POA: Diagnosis not present

## 2023-12-31 DIAGNOSIS — R110 Nausea: Secondary | ICD-10-CM

## 2023-12-31 DIAGNOSIS — E782 Mixed hyperlipidemia: Secondary | ICD-10-CM | POA: Diagnosis not present

## 2023-12-31 MED ORDER — FLUOCINOLONE ACETONIDE 0.025 % EX OINT
TOPICAL_OINTMENT | Freq: Two times a day (BID) | CUTANEOUS | 0 refills | Status: AC
Start: 2023-12-31 — End: ?

## 2023-12-31 MED ORDER — BUSPIRONE HCL 5 MG PO TABS: 5.0000 mg | ORAL_TABLET | Freq: Two times a day (BID) | ORAL | 2 refills | Status: AC

## 2023-12-31 MED ORDER — PROMETHAZINE HCL 25 MG PO TABS: 25.0000 mg | ORAL_TABLET | Freq: Three times a day (TID) | ORAL | 1 refills | Status: AC | PRN

## 2023-12-31 NOTE — Assessment & Plan Note (Signed)
 Uncontrolled, multiple factors increasing her stress levels including 2 stressful jobs and her daughter's performance Advised to perform relaxation techniques, can try yoga Has hydroxyzine  25 mg twice daily as needed, she takes mostly at bedtime for insomnia If persistent, will consider SSRI - has already tried his Zoloft  and Paxil  without much benefit in the past

## 2023-12-31 NOTE — Assessment & Plan Note (Signed)
 Lab Results  Component Value Date   HGBA1C 7.7 (H) 08/13/2023   New onset Started Ozempic  in the last week, considering its cardiovascular benefits - increase dose as tolerated Advised to follow diabetic diet On statin now F/u CMP and lipid panel Diabetic eye exam: Advised to follow up with Ophthalmology for diabetic eye exam

## 2023-12-31 NOTE — Patient Instructions (Signed)
 Please take Buspar  as prescribed for anxiety.  Please use Fluocinolone  cream for persistent ear dryness.  Please continue to take other medications as prescribed.  Please continue to follow low carb diet and perform moderate exercise/walking at least 150 mins/week.

## 2023-12-31 NOTE — Progress Notes (Unsigned)
 Established Patient Office Visit  Subjective:  Patient ID: Anna Hartman, female    DOB: 09-17-1990  Age: 33 y.o. MRN: [id number]  CC:  Chief Complaint  Patient presents with   Medical Management of Chronic Issues    2 week f/u     HPI Anna Hartman is a 33 y.o. female with past medical history of DM, HLD, GERD and GAD who presents for f/u of her chronic medical conditions.  DM: Her HbA1c was 7.7 - new onset DM in 05/25. She is tolerating Ozempic  well. She has mild nausea, but denies major GI issues. Denies polyuria or polyphagia currently.  She has improved her diet and increase her physical activity.   HLD: Lipid profile showed elevated LDL - 149. She has not started statin yet.  GAD: She tried Hydroxyzine , but felt sleepy with it. She complains of feeling stressed and lack of sleep, which are chronic for many years. She has tried Zoloft  and Paxil  in the past without much relief. She reports that she felt numb with Zoloft . 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 She is coparenting her daughter -every other week. Denies any recent panic episode, but does report palpitations when she gets stressed. Denies any chest pain or dyspnea currently.  She has chronic dryness of bilateral ears.  She has tried using Vaseline with mild relief.  Past Medical History:  Diagnosis Date   Contraceptive management 03/23/2013   Diabetes (HCC)    Genital warts    Miscarriage    Missed periods 01/18/2014   Positive pregnancy test 01/18/2014   Pregnant 01/18/2014   Supervision of normal pregnancy in first trimester 01/25/2014   Urinary frequency 01/18/2014   Vaginal discharge 03/08/2013   Yeast infection 03/23/2013    Past Surgical History:  Procedure Laterality Date   ANKLE FRACTURE SURGERY     CHOLECYSTECTOMY N/A 02/19/2015   Procedure: LAPAROSCOPIC CHOLECYSTECTOMY;  Surgeon: Oneil Budge, MD;  Location: [facility];  Service: General;  Laterality: N/A;   left  shoulder surgery      Family History  Problem Relation Age of Onset   Cancer Paternal Grandmother    COPD Maternal Grandmother    Diabetes Father     Social History   Socioeconomic History   Marital status: Single    Spouse name: Not on file   Number of children: 1   Years of education: Not on file   Highest education level: Not on file  Occupational History   Not on file  Tobacco Use   Smoking status: Never   Smokeless tobacco: Never  Vaping Use   Vaping status: Never Used  Substance and Sexual Activity   Alcohol use: Yes    Comment: occasionally   Drug use: No   Sexual activity: Yes    Birth control/protection: I.U.D.  Other Topics Concern   Not on file  Social History Narrative   Not on file   Social Drivers of Health   Financial Resource Strain: Low Risk  (08/25/2019)   Overall Financial Resource Strain (CARDIA)    Difficulty of Paying Living Expenses: Not very hard  Food Insecurity: No Food Insecurity (08/25/2019)   Hunger Vital Sign    Worried About Running Out of Food in the Last Year: Never true    Ran Out of Food in the Last Year: Never true  Transportation Needs: No Transportation Needs (08/25/2019)   PRAPARE - Administrator, Civil Service (Medical): No  Lack of Transportation (Non-Medical): No  Physical Activity: Sufficiently Active (08/25/2019)   Exercise Vital Sign    Days of Exercise per Week: 3 days    Minutes of Exercise per Session: 60 min  Stress: Stress Concern Present (08/25/2019)   [employer] of Occupational Health - Occupational Stress Questionnaire    Feeling of Stress : Rather much  Social Connections: Moderately Isolated (08/25/2019)   Social Connection and Isolation Panel    Frequency of Communication with Friends and Family: More than three times a week    Frequency of Social Gatherings with Friends and Family: More than three times a week    Attends Religious Services: Never    Database administrator or  Organizations: No    Attends Banker Meetings: Never    Marital Status: Living with partner  Intimate Partner Violence: Not At Risk (08/25/2019)   Humiliation, Afraid, Rape, and Kick questionnaire    Fear of Current or Ex-Partner: No    Emotionally Abused: No    Physically Abused: No    Sexually Abused: No    Outpatient Medications Prior to Visit  Medication Sig Dispense Refill   atorvastatin  (LIPITOR) 20 MG tablet Take 1 tablet (20 mg total) by mouth daily. 90 tablet 3   Blood Glucose Monitoring Suppl DEVI 1 each by Does not apply route in the morning, at noon, and at bedtime. May substitute to any manufacturer covered by patient's insurance. 1 each 0   Fexofenadine HCl (ALLEGRA ALLERGY PO) Take by mouth.     fluconazole  (DIFLUCAN ) 150 MG tablet Take 1 tablet (150 mg total) by mouth every three (3) days as needed. 2 tablet 0   Glucose Blood (BLOOD GLUCOSE TEST STRIPS) STRP 1 each by In Vitro route in the morning, at noon, and at bedtime. May substitute to any manufacturer covered by patient's insurance. 100 strip 0   hydrOXYzine  (ATARAX ) 25 MG tablet Take 1 tablet (25 mg total) by mouth 2 (two) times daily as needed for anxiety. 60 tablet 2   Lancets Misc. MISC 1 each by Does not apply route in the morning, at noon, and at bedtime. May substitute to any manufacturer covered by patient's insurance. 100 each 0   ondansetron  (ZOFRAN ) 4 MG tablet Take 1 tablet (4 mg total) by mouth every 8 (eight) hours as needed for nausea or vomiting. 20 tablet 0   OZEMPIC , 0.25 OR 0.5 MG/DOSE, 2 MG/3ML SOPN INJECT 0.5 MG INTO THE SKIN EVERY 7 DAYS 3 mL 0   pantoprazole  (PROTONIX ) 40 MG tablet Take 1 tablet (40 mg total) by mouth 2 (two) times daily. 60 tablet 5   sucralfate (CARAFATE) 1 g tablet Take 1 g by mouth 4 (four) times daily.     promethazine  (PHENERGAN ) 25 MG tablet Take 1 tablet (25 mg total) by mouth every 8 (eight) hours as needed for nausea or vomiting. 30 tablet 0   No  facility-administered medications prior to visit.    No Known Allergies  ROS Review of Systems  Constitutional:  Negative for chills and fever.  HENT:  Negative for congestion, sinus pressure, sinus pain and sore throat.   Eyes:  Negative for pain and discharge.  Respiratory:  Negative for cough and shortness of breath.   Cardiovascular:  Negative for chest pain and palpitations.  Gastrointestinal:  Positive for nausea. Negative for diarrhea and vomiting.  Endocrine: Negative for polydipsia and polyuria.  Genitourinary:  Negative for dysuria and hematuria.  Musculoskeletal:  Negative for  neck pain and neck stiffness.  Skin:  Negative for rash.  Neurological:  Negative for dizziness and weakness.  Psychiatric/Behavioral:  Positive for sleep disturbance. Negative for agitation and behavioral problems. The patient is nervous/anxious.       Objective:    Physical Exam Vitals reviewed.  Constitutional:      General: She is not in acute distress.    Appearance: She is not diaphoretic.  HENT:     Head: Normocephalic and atraumatic.     Nose: Nose normal.     Mouth/Throat:     Mouth: Mucous membranes are moist.  Eyes:     General: No scleral icterus.    Extraocular Movements: Extraocular movements intact.  Cardiovascular:     Rate and Rhythm: Normal rate and regular rhythm.     Heart sounds: Normal heart sounds. No murmur heard. Pulmonary:     Breath sounds: Normal breath sounds. No wheezing or rales.  Musculoskeletal:     Cervical back: Neck supple. No tenderness.     Right lower leg: No edema.     Left lower leg: No edema.  Skin:    General: Skin is warm.     Findings: No rash.  Neurological:     General: No focal deficit present.     Mental Status: She is alert and oriented to person, place, and time.  Psychiatric:        Mood and Affect: Mood is anxious.        Behavior: Behavior normal.     BP 118/75   Pulse 81   Ht 5' 1 (1.549 m)   Wt 169 lb 12.8 oz (77 kg)    SpO2 97%   BMI 32.08 kg/m  Wt Readings from Last 3 Encounters:  12/31/23 169 lb 12.8 oz (77 kg)  08/26/23 183 lb (83 kg)  08/13/23 182 lb 6.4 oz (82.7 kg)    Lab Results  Component Value Date   TSH 1.460 08/13/2023   Lab Results  Component Value Date   WBC 9.2 08/13/2023   HGB 13.5 08/13/2023   HCT 41.8 08/13/2023   MCV 85 08/13/2023   PLT 277 08/13/2023   Lab Results  Component Value Date   NA 139 12/31/2023   K 4.1 12/31/2023   CO2 24 12/31/2023   GLUCOSE 92 12/31/2023   BUN 9 12/31/2023   CREATININE 0.79 12/31/2023   BILITOT 0.6 12/31/2023   ALKPHOS 98 12/31/2023   AST 22 12/31/2023   ALT 8 12/31/2023   PROT 7.4 12/31/2023   ALBUMIN 4.4 12/31/2023   CALCIUM  9.6 12/31/2023   ANIONGAP 8 02/13/2015   EGFR 100 12/31/2023   Lab Results  Component Value Date   CHOL 181 12/31/2023   Lab Results  Component Value Date   HDL 46 12/31/2023   Lab Results  Component Value Date   LDLCALC 115 (H) 12/31/2023   Lab Results  Component Value Date   TRIG 112 12/31/2023   Lab Results  Component Value Date   CHOLHDL 3.9 12/31/2023   Lab Results  Component Value Date   HGBA1C 6.3 (H) 12/31/2023      Assessment & Plan:   Problem List Items Addressed This Visit       Endocrine   Type 2 diabetes mellitus with hyperglycemia (HCC) - Primary   Lab Results  Component Value Date   HGBA1C 7.7 (H) 08/13/2023   New onset Started Ozempic  in the last week, considering its cardiovascular benefits - increase dose as  tolerated Advised to follow diabetic diet On statin now F/u CMP and lipid panel Diabetic eye exam: Advised to follow up with Ophthalmology for diabetic eye exam      Relevant Orders   CMP14+EGFR (Completed)   Hemoglobin A1c (Completed)   Urine Microalbumin w/creat. 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 Fluocinolone  ointment prescribed for external use only      Relevant  Medications   fluocinolone  (SYNALAR ) 0.025 % ointment     Other   GAD (generalized anxiety disorder)   Uncontrolled, multiple factors increasing her stress levels including 2 stressful jobs and her daughter's performance Advised to perform relaxation techniques, can try yoga Has hydroxyzine  25 mg twice daily as needed, she takes mostly at bedtime for insomnia Added Buspar  5 mg BID for anxiety If persistent, will consider SSRI - has already tried his Zoloft  and Paxil  without much benefit in the past      Relevant Medications   busPIRone  (BUSPAR ) 5 MG tablet   Mixed hyperlipidemia   Last lipid profile reviewed, recheck lipid profile Continue atorvastatin  20 mg QD Advised to follow low-carb diet      Relevant Orders   Lipid Profile (Completed)   Other Visit Diagnoses       Nausea without vomiting       Relevant Medications   promethazine  (PHENERGAN ) 25 MG tablet       Meds ordered this encounter  Medications   busPIRone  (BUSPAR ) 5 MG tablet    Sig: Take 1 tablet (5 mg total) by mouth 2 (two) times daily.    Dispense:  60 tablet    Refill:  2   fluocinolone  (SYNALAR ) 0.025 % ointment    Sig: Apply topically 2 (two) times daily.    Dispense:  30 g    Refill:  0   promethazine  (PHENERGAN ) 25 MG tablet    Sig: Take 1 tablet (25 mg total) by mouth every 8 (eight) hours as needed for nausea or vomiting.    Dispense:  30 tablet    Refill:  1    Follow-up: Return in about 4 months (around 05/01/2024) for DM.    Suzzane MARLA Blanch, MD

## 2023-12-31 NOTE — Assessment & Plan Note (Addendum)
 Last lipid profile reviewed, recheck lipid profile Continue atorvastatin  20 mg QD Advised to follow low-carb diet

## 2024-01-01 ENCOUNTER — Ambulatory Visit: Payer: Self-pay | Admitting: Internal Medicine

## 2024-01-01 ENCOUNTER — Other Ambulatory Visit: Payer: Self-pay | Admitting: Internal Medicine

## 2024-01-01 DIAGNOSIS — E1165 Type 2 diabetes mellitus with hyperglycemia: Secondary | ICD-10-CM

## 2024-01-01 LAB — LIPID PANEL
Chol/HDL Ratio: 3.9 ratio (ref 0.0–4.4)
Cholesterol, Total: 181 mg/dL (ref 100–199)
HDL: 46 mg/dL (ref >39)
LDL Chol Calc (NIH): 115 mg/dL — ABNORMAL HIGH (ref 0–99)
Triglycerides: 112 mg/dL (ref 0–149)
VLDL Cholesterol Cal: 20 mg/dL (ref 5–40)

## 2024-01-01 LAB — CMP14+EGFR
ALT: 8 IU/L (ref 0–32)
AST: 22 IU/L (ref 0–40)
Albumin: 4.4 g/dL (ref 3.9–4.9)
Alkaline Phosphatase: 98 IU/L (ref 41–116)
BUN/Creatinine Ratio: 11 (ref 9–23)
BUN: 9 mg/dL (ref 6–20)
Bilirubin Total: 0.6 mg/dL (ref 0.0–1.2)
CO2: 24 mmol/L (ref 20–29)
Calcium: 9.6 mg/dL (ref 8.7–10.2)
Chloride: 100 mmol/L (ref 96–106)
Creatinine, Ser: 0.79 mg/dL (ref 0.57–1.00)
Globulin, Total: 3 g/dL (ref 1.5–4.5)
Glucose: 92 mg/dL (ref 70–99)
Potassium: 4.1 mmol/L (ref 3.5–5.2)
Sodium: 139 mmol/L (ref 134–144)
Total Protein: 7.4 g/dL (ref 6.0–8.5)
eGFR: 100 mL/min/1.73 (ref >59)

## 2024-01-01 LAB — HEMOGLOBIN A1C
Est. average glucose Bld gHb Est-mCnc: 134 mg/dL
Hgb A1c MFr Bld: 6.3 % — ABNORMAL HIGH (ref 4.8–5.6)

## 2024-01-01 MED ORDER — OZEMPIC (0.25 OR 0.5 MG/DOSE) 2 MG/3ML ~~LOC~~ SOPN: 0.5000 mg | PEN_INJECTOR | SUBCUTANEOUS | 5 refills | Status: AC

## 2024-01-01 NOTE — Assessment & Plan Note (Addendum)
 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

## 2024-01-02 LAB — MICROALBUMIN / CREATININE URINE RATIO
Creatinine, Urine: 204.9 mg/dL
Microalb/Creat Ratio: 6 mg/g{creat} (ref 0–29)
Microalbumin, Urine: 12 ug/mL

## 2024-01-08 ENCOUNTER — Telehealth: Payer: Self-pay

## 2024-01-08 ENCOUNTER — Other Ambulatory Visit: Payer: Self-pay | Admitting: Internal Medicine

## 2024-01-08 DIAGNOSIS — N3000 Acute cystitis without hematuria: Secondary | ICD-10-CM

## 2024-01-08 NOTE — Telephone Encounter (Signed)
 Copied from CRM #[id number]. Topic: Clinical - Medical Advice >> Jan 08, 2024  9:05 AM Darshell M wrote: Reason for CRM: Patient concerned she has a UTI or Kidney infection. Was seen last week and had urinalysis but symptoms developed after. She wants to come in for urinalysis.   ----------------------------------------------------------------------- From previous Reason for Contact - Scheduling: Patient/patient representative is calling to schedule an appointment. Refer to attachments for appointment information.
